# Patient Record
Sex: Male | Born: 1975 | Race: White | Hispanic: No | Marital: Married | Smoking: Never smoker
Health system: Southern US, Community
[De-identification: ages and names within clinical notes are randomized; demographics above are authoritative.]

## PROBLEM LIST (undated history)

## (undated) DIAGNOSIS — R42 Dizziness and giddiness: Secondary | ICD-10-CM

## (undated) DIAGNOSIS — R7989 Other specified abnormal findings of blood chemistry: Secondary | ICD-10-CM

## (undated) DIAGNOSIS — G08 Intracranial and intraspinal phlebitis and thrombophlebitis: Secondary | ICD-10-CM

## (undated) DIAGNOSIS — F419 Anxiety disorder, unspecified: Secondary | ICD-10-CM

## (undated) DIAGNOSIS — I82409 Acute embolism and thrombosis of unspecified deep veins of unspecified lower extremity: Secondary | ICD-10-CM

## (undated) DIAGNOSIS — D6852 Prothrombin gene mutation: Secondary | ICD-10-CM

## (undated) DIAGNOSIS — R945 Abnormal results of liver function studies: Secondary | ICD-10-CM

## (undated) DIAGNOSIS — J45909 Unspecified asthma, uncomplicated: Secondary | ICD-10-CM

## (undated) HISTORY — DX: Other specified abnormal findings of blood chemistry: R79.89

## (undated) HISTORY — DX: Abnormal results of liver function studies: R94.5

## (undated) HISTORY — PX: OTHER SURGICAL HISTORY: SHX169

---

## 2009-09-16 DIAGNOSIS — K219 Gastro-esophageal reflux disease without esophagitis: Secondary | ICD-10-CM | POA: Insufficient documentation

## 2011-01-14 DIAGNOSIS — J309 Allergic rhinitis, unspecified: Secondary | ICD-10-CM | POA: Insufficient documentation

## 2011-08-30 DIAGNOSIS — J329 Chronic sinusitis, unspecified: Secondary | ICD-10-CM | POA: Insufficient documentation

## 2017-03-17 ENCOUNTER — Other Ambulatory Visit: Payer: Self-pay | Admitting: Family Medicine

## 2017-03-17 DIAGNOSIS — I82531 Chronic embolism and thrombosis of right popliteal vein: Secondary | ICD-10-CM

## 2017-03-21 ENCOUNTER — Ambulatory Visit
Admission: RE | Admit: 2017-03-21 | Discharge: 2017-03-21 | Disposition: A | Discharge status: Home / Self Care | Payer: BLUE CROSS/BLUE SHIELD | Source: Ambulatory Visit | Attending: Family Medicine | Admitting: Family Medicine

## 2017-03-21 DIAGNOSIS — I82531 Chronic embolism and thrombosis of right popliteal vein: Secondary | ICD-10-CM

## 2017-05-28 DIAGNOSIS — H01002 Unspecified blepharitis right lower eyelid: Secondary | ICD-10-CM | POA: Diagnosis not present

## 2017-05-29 DIAGNOSIS — H00012 Hordeolum externum right lower eyelid: Secondary | ICD-10-CM | POA: Diagnosis not present

## 2017-06-16 DIAGNOSIS — I82531 Chronic embolism and thrombosis of right popliteal vein: Secondary | ICD-10-CM | POA: Diagnosis not present

## 2017-06-16 DIAGNOSIS — Z23 Encounter for immunization: Secondary | ICD-10-CM | POA: Diagnosis not present

## 2017-06-29 ENCOUNTER — Emergency Department (HOSPITAL_COMMUNITY)
Admission: EM | Admit: 2017-06-29 | Discharge: 2017-06-29 | Disposition: A | Discharge status: Home / Self Care | Payer: BLUE CROSS/BLUE SHIELD | Attending: Emergency Medicine | Admitting: Emergency Medicine

## 2017-06-29 ENCOUNTER — Emergency Department (HOSPITAL_COMMUNITY): Payer: BLUE CROSS/BLUE SHIELD

## 2017-06-29 ENCOUNTER — Encounter (HOSPITAL_COMMUNITY): Payer: Self-pay | Admitting: Emergency Medicine

## 2017-06-29 DIAGNOSIS — Z7901 Long term (current) use of anticoagulants: Secondary | ICD-10-CM | POA: Insufficient documentation

## 2017-06-29 DIAGNOSIS — J45909 Unspecified asthma, uncomplicated: Secondary | ICD-10-CM | POA: Insufficient documentation

## 2017-06-29 DIAGNOSIS — R002 Palpitations: Secondary | ICD-10-CM

## 2017-06-29 HISTORY — DX: Anxiety disorder, unspecified: F41.9

## 2017-06-29 HISTORY — DX: Unspecified asthma, uncomplicated: J45.909

## 2017-06-29 HISTORY — DX: Dizziness and giddiness: R42

## 2017-06-29 HISTORY — DX: Acute embolism and thrombosis of unspecified deep veins of unspecified lower extremity: I82.409

## 2017-06-29 LAB — CBC
HCT: 43.5 % (ref 39.0–52.0)
Hemoglobin: 15.3 g/dL (ref 13.0–17.0)
MCH: 30 pg (ref 26.0–34.0)
MCHC: 35.2 g/dL (ref 30.0–36.0)
MCV: 85.3 fL (ref 78.0–100.0)
PLATELETS: 217 10*3/uL (ref 150–400)
RBC: 5.1 MIL/uL (ref 4.22–5.81)
RDW: 13.8 % (ref 11.5–15.5)
WBC: 9.9 10*3/uL (ref 4.0–10.5)

## 2017-06-29 LAB — I-STAT TROPONIN, ED: Troponin i, poc: 0 ng/mL (ref 0.00–0.08)

## 2017-06-29 LAB — BASIC METABOLIC PANEL
Anion gap: 8 (ref 5–15)
BUN: 13 mg/dL (ref 6–20)
CO2: 27 mmol/L (ref 22–32)
CREATININE: 0.99 mg/dL (ref 0.61–1.24)
Calcium: 9 mg/dL (ref 8.9–10.3)
Chloride: 104 mmol/L (ref 101–111)
GFR calc Af Amer: >60 mL/min (ref >60)
GLUCOSE: 94 mg/dL (ref 65–99)
Potassium: 4 mmol/L (ref 3.5–5.1)
SODIUM: 139 mmol/L (ref 135–145)

## 2017-06-29 NOTE — ED Triage Notes (Signed)
Pt c/o palpitations and feeling like his heart is racing x 1 hour. States he was woken from sleep, denies chest pain, unsure if he was feeling short of breath. Hx panic attacks, and DVT. Has been taking eliquis for DVT, but has been forgetting to take doses in the last few weeks.

## 2017-06-29 NOTE — ED Provider Notes (Signed)
[facility]-EMERGENCY DEPT Provider Note   CSN: [id number] Arrival date & time: 06/29/17  0101   History   Chief Complaint Chief Complaint  Patient presents with  . Palpitations    HPI Shane Martin is a 41 y.o. male.  HPI   Patient has a PMH of anxiety, asthma, DVT, vertigo comes to the ER with complaints of palpitations and sensation of his heart racing for 1 hour that woke him up from sleep. His partner who is present says that he was "freaking out" asking if his voice sounded different and moving his arms and legs to make sure they still worked.  He is supposed to take Eliquis for hx of DVT but admits he has been forgetting to take the second dose this past few weeks. He had an ankle fracture 6 months ago which incited the DVT, he had a repeat Duplex recently that showed complete resolution of the DVT and he completes his course of Eliquis in the next few weeks. He denies having chest pain or shortness of breath. He admits to having hx of anxiety but nothing like this has happened before. The incident stopped on its own, the wife describes his chest as pounding fast to a regular rhythm    Past Medical History:  Diagnosis Date  . Anxiety   . Asthma   . DVT (deep venous thrombosis) (HCC)   . Vertigo     There are no active problems to display for this patient.  History reviewed. No pertinent surgical history.   Home Medications    Prior to Admission medications   Medication Sig Start Date End Date Taking? Authorizing Provider  apixaban (ELIQUIS) 5 MG TABS tablet Take 5 mg by mouth 2 (two) times daily.   Yes [provider]  cholecalciferol (VITAMIN D) 1000 units tablet Take 1,000 Units by mouth daily.   Yes [provider]  DiphenhydrAMINE HCl (ZZZQUIL) 50 MG/30ML LIQD Take 30 mLs by mouth at bedtime as needed (sleep).   Yes [provider]  vitamin B-12 (CYANOCOBALAMIN) 1000 MCG tablet Take 3,000 mcg by mouth daily.   Yes [provider]     Family History No family history on file.  Social History Social History  Substance Use Topics  . Smoking status: Never Smoker  . Smokeless tobacco: Never Used  . Alcohol use No     Allergies   Patient has no known allergies.   Review of Systems Review of Systems Negative ROS aside from pertinent positives and negatives as listed in HPI   Physical Exam Updated Vital Signs BP 119/72   Pulse (!) 54   Temp 97.7 F (36.5 C) (Oral)   Resp 12   Ht  (1.778 m)   Wt 93 kg (205 lb)   SpO2 99%   BMI 29.41 kg/m   Physical Exam  Constitutional: He appears well-developed and well-nourished. No distress.  HENT:  Head: Normocephalic and atraumatic.  Right Ear: Tympanic membrane and ear canal normal.  Left Ear: Tympanic membrane and ear canal normal.  Nose: Nose normal.  Mouth/Throat: Uvula is midline, oropharynx is clear and moist and mucous membranes are normal.  Eyes: Pupils are equal, round, and reactive to light.  Neck: Normal range of motion. Neck supple.  Cardiovascular: Normal rate and regular rhythm.   Pulmonary/Chest: Effort normal.  Abdominal: Soft.  No signs of abdominal distention  Musculoskeletal:  No LE swelling  Neurological: He is alert.  Acting at baseline  Skin: Skin is warm and  dry. No rash noted.  Nursing note and vitals reviewed.   ED Treatments / Results  Labs (all labs ordered are listed, but only abnormal results are displayed) Labs Reviewed  BASIC METABOLIC PANEL  CBC  I-STAT TROPONIN, ED    EKG  EKG Interpretation None       Radiology Dg Chest 2 View  Result Date: 06/29/2017 CLINICAL DATA:  Acute onset of palpitations.  Initial encounter. EXAM: CHEST  2 VIEW COMPARISON:  None. FINDINGS: The lungs are well-aerated and clear. There is no evidence of focal opacification, pleural effusion or pneumothorax. The heart is normal in size; the mediastinal contour is within normal limits. No acute osseous abnormalities are seen.  IMPRESSION: No acute cardiopulmonary process seen. Electronically Signed   By: Roanna Raider M.D.   On: 06/29/2017 02:07    Procedures Procedures (including critical care time)  Medications Ordered in ED Medications - No data to display   Initial Impression / Assessment and Plan / ED Course  I have reviewed the triage vital signs and the nursing notes.  Pertinent labs & imaging results that were available during my care of the patient were reviewed by me and considered in my medical decision making (see chart for details).     EKG shows normal sinus rhythm heart rate is 80.  Neg troponin, BMP and CBC are unremarkable. Chest xray shows no acute cardiopulmonary process. The Telemetry monitor does not show any abnormalities. It is unclear what tachycardic rhythm took place. What he does describes does sound like a component of anxiety, however its tough to say. Will refer him to Cardiology so that they can do routine follow-up because of strong family history as well as consider a holter monitor.   I also STRONGLY suggest he be diligent in taking his Eliquis correctly until it is complete to avoid complications. At this time he is sating 100% on room air, HR 60s, he never had any chest pain or SOB, no EKG abnormalities, supposedly DVT has resolved. I have very low suspicion for DVT at this time.  Final Clinical Impressions(s) / ED Diagnoses   Final diagnoses:  Palpitations    New Prescriptions New Prescriptions   No medications on file     Marlon Pel, Cordelia Poche 06/29/17 1610    Raeford Razor, MD 07/05/17 1002

## 2017-08-31 DIAGNOSIS — R42 Dizziness and giddiness: Secondary | ICD-10-CM | POA: Diagnosis not present

## 2017-08-31 DIAGNOSIS — L729 Follicular cyst of the skin and subcutaneous tissue, unspecified: Secondary | ICD-10-CM | POA: Diagnosis not present

## 2017-08-31 DIAGNOSIS — R740 Nonspecific elevation of levels of transaminase and lactic acid dehydrogenase [LDH]: Secondary | ICD-10-CM | POA: Diagnosis not present

## 2017-08-31 DIAGNOSIS — E7800 Pure hypercholesterolemia, unspecified: Secondary | ICD-10-CM | POA: Diagnosis not present

## 2017-09-13 DIAGNOSIS — R42 Dizziness and giddiness: Secondary | ICD-10-CM | POA: Insufficient documentation

## 2017-09-13 DIAGNOSIS — F419 Anxiety disorder, unspecified: Secondary | ICD-10-CM | POA: Insufficient documentation

## 2017-09-13 DIAGNOSIS — J45909 Unspecified asthma, uncomplicated: Secondary | ICD-10-CM | POA: Insufficient documentation

## 2017-09-13 DIAGNOSIS — I82409 Acute embolism and thrombosis of unspecified deep veins of unspecified lower extremity: Secondary | ICD-10-CM | POA: Insufficient documentation

## 2017-09-20 ENCOUNTER — Ambulatory Visit: Payer: BLUE CROSS/BLUE SHIELD | Admitting: Internal Medicine

## 2017-09-25 DIAGNOSIS — J4520 Mild intermittent asthma, uncomplicated: Secondary | ICD-10-CM | POA: Diagnosis not present

## 2017-09-25 DIAGNOSIS — R42 Dizziness and giddiness: Secondary | ICD-10-CM | POA: Diagnosis not present

## 2017-09-25 DIAGNOSIS — F419 Anxiety disorder, unspecified: Secondary | ICD-10-CM | POA: Diagnosis not present

## 2017-10-02 ENCOUNTER — Ambulatory Visit: Payer: BLUE CROSS/BLUE SHIELD | Admitting: Internal Medicine

## 2017-10-02 NOTE — Progress Notes (Signed)
    Referring-Kristin Ward DO Reason for referral-palpitations  HPI: 42 year old male for evaluation of palpitations at request of Kristen Ward DO. Patient seen in October with palpitations. Potassium 4.0. Troponin normal. Chest x-ray negative. Electrocardiogram showed sinus rhythm with no ST changes. Patient had his first episode of palpitations in October. He awoke from sleep with his heart racing. There was mild dyspnea and dizziness. No chest pain. Symptoms lasted approximately 30 minutes and resolved. He did well until approximately one month ago and he has had approximately 6 episodes of heart racing since that time. There is associated vertigo. No chest pain or syncope. He otherwise denies dyspnea on exertion, orthopnea, PND, pedal edema, exertional chest pain. Cardiology asked to evaluate.  Current Outpatient Medications  Medication Sig Dispense Refill  . cholecalciferol (VITAMIN D) 1000 units tablet Take 1,000 Units by mouth daily.    . vitamin B-12 (CYANOCOBALAMIN) 1000 MCG tablet Take 3,000 mcg by mouth daily.     No current facility-administered medications for this visit.     Allergies  Allergen Reactions  . Cats Claw (Uncaria Tomentosa) Shortness Of Breath     Past Medical History:  Diagnosis Date  . Anxiety   . Asthma   . DVT (deep venous thrombosis) (HCC)   . Elevated LFTs   . Vertigo     Past Surgical History:  Procedure Laterality Date  . No prior surgery      Social History   Socioeconomic History  . Marital status: Married    Spouse name: Not on file  . Number of children: Not on file  . Years of education: Not on file  . Highest education level: Not on file  Social Needs  . Financial resource strain: Not on file  . Food insecurity - worry: Not on file  . Food insecurity - inability: Not on file  . Transportation needs - medical: Not on file  . Transportation needs - non-medical: Not on file  Occupational History  . Not on file  Tobacco Use  .  Smoking status: Never Smoker  . Smokeless tobacco: Never Used  Substance and Sexual Activity  . Alcohol use: No  . Drug use: No  . Sexual activity: Not on file  Other Topics Concern  . Not on file  Social History Narrative  . Not on file    Family History  Problem Relation Age of Onset  . CAD Father     ROS: Anxiety but no fevers or chills, productive cough, hemoptysis, dysphasia, odynophagia, melena, hematochezia, dysuria, hematuria, rash, seizure activity, orthopnea, PND, pedal edema, claudication. Remaining systems are negative.  Physical Exam:   Blood pressure 102/68, pulse 70, height 5' 9.5" (1.765 m), weight 204 lb (92.5 kg).  General:  Well developed/well nourished in NAD Skin warm/dry Patient not depressed No peripheral clubbing Back-normal HEENT-normal/normal eyelids Neck supple/normal carotid upstroke bilaterally; no bruits; no JVD; no thyromegaly chest - CTA/ normal expansion CV - RRR/normal S1 and S2; no murmurs, rubs or gallops;  PMI nondisplaced Abdomen -NT/ND, no HSM, no mass, + bowel sounds, no bruit 2+ femoral pulses, no bruits Ext-no edema, chords, 2+ DP Neuro-grossly nonfocal  ECG - normal sinus rhythm with no ST changes. personally reviewed  A/P  1 palpitations- etiology unclear. I will arrange an event monitor to further assess. Check echocardiogram for LV function. Check TSH.  2 history of anxiety-management per primary care.  3 asthma-symptoms are well-controlled. Follow-up primary care.  Olga MillersBrian Crenshaw, MD

## 2017-10-06 ENCOUNTER — Ambulatory Visit (INDEPENDENT_AMBULATORY_CARE_PROVIDER_SITE_OTHER): Payer: BLUE CROSS/BLUE SHIELD | Admitting: Cardiology

## 2017-10-06 ENCOUNTER — Encounter: Payer: Self-pay | Admitting: Cardiology

## 2017-10-06 VITALS — BP 102/68 | HR 70 | Ht 69.5 in | Wt 204.0 lb

## 2017-10-06 DIAGNOSIS — R002 Palpitations: Secondary | ICD-10-CM | POA: Diagnosis not present

## 2017-10-06 NOTE — Patient Instructions (Signed)
Medication Instructions:   NO CHANGE  Labwork:  Your physician recommends that you HAVE LAB WORK TODAY  Testing/Procedures:  Your physician has recommended that you wear an event monitor. Event monitors are medical devices that record the heart's electrical activity. Doctors most often us these monitors to diagnose arrhythmias. Arrhythmias are problems with the speed or rhythm of the heartbeat. The monitor is a small, portable device. You can wear one while you do your normal daily activities. This is usually used to diagnose what is causing palpitations/syncope (passing out).   Your physician has requested that you have an echocardiogram. Echocardiography is a painless test that uses sound waves to create images of your heart. It provides your doctor with information about the size and shape of your heart and how well your heart's chambers and valves are working. This procedure takes approximately one hour. There are no restrictions for this procedure.    Follow-Up:  Your physician recommends that you schedule a follow-up appointment in: 3 MONTHS WITH DR Jens SomRENSHAW

## 2017-10-07 LAB — TSH: TSH: 3.31 u[IU]/mL (ref 0.450–4.500)

## 2017-10-17 ENCOUNTER — Other Ambulatory Visit: Payer: Self-pay

## 2017-10-17 ENCOUNTER — Ambulatory Visit (INDEPENDENT_AMBULATORY_CARE_PROVIDER_SITE_OTHER): Payer: BLUE CROSS/BLUE SHIELD

## 2017-10-17 ENCOUNTER — Ambulatory Visit (HOSPITAL_COMMUNITY): Payer: BLUE CROSS/BLUE SHIELD | Attending: Cardiovascular Disease

## 2017-10-17 DIAGNOSIS — Z86718 Personal history of other venous thrombosis and embolism: Secondary | ICD-10-CM | POA: Diagnosis not present

## 2017-10-17 DIAGNOSIS — I071 Rheumatic tricuspid insufficiency: Secondary | ICD-10-CM | POA: Diagnosis not present

## 2017-10-17 DIAGNOSIS — R002 Palpitations: Secondary | ICD-10-CM

## 2017-11-02 DIAGNOSIS — M7652 Patellar tendinitis, left knee: Secondary | ICD-10-CM | POA: Diagnosis not present

## 2017-12-27 NOTE — Progress Notes (Deleted)
      HPI: FU palpitations.  Echocardiogram January 2019 showed normal LV function.  Monitor January 2019 showed sinus bradycardia, normal sinus rhythm and sinus tachycardia.  Since last seen,    Current Outpatient Medications  Medication Sig Dispense Refill  . cholecalciferol (VITAMIN D) 1000 units tablet Take 1,000 Units by mouth daily.    . vitamin B-12 (CYANOCOBALAMIN) 1000 MCG tablet Take 3,000 mcg by mouth daily.     No current facility-administered medications for this visit.      Past Medical History:  Diagnosis Date  . Anxiety   . Asthma   . DVT (deep venous thrombosis) (HCC)   . Elevated LFTs   . Vertigo     Past Surgical History:  Procedure Laterality Date  . No prior surgery      Social History   Socioeconomic History  . Marital status: Married    Spouse name: Not on file  . Number of children: Not on file  . Years of education: Not on file  . Highest education level: Not on file  Occupational History  . Not on file  Social Needs  . Financial resource strain: Not on file  . Food insecurity:    Worry: Not on file    Inability: Not on file  . Transportation needs:    Medical: Not on file    Non-medical: Not on file  Tobacco Use  . Smoking status: Never Smoker  . Smokeless tobacco: Never Used  Substance and Sexual Activity  . Alcohol use: No  . Drug use: No  . Sexual activity: Not on file  Lifestyle  . Physical activity:    Days per week: Not on file    Minutes per session: Not on file  . Stress: Not on file  Relationships  . Social connections:    Talks on phone: Not on file    Gets together: Not on file    Attends religious service: Not on file    Active member of club or organization: Not on file    Attends meetings of clubs or organizations: Not on file    Relationship status: Not on file  . Intimate partner violence:    Fear of current or ex partner: Not on file    Emotionally abused: Not on file    Physically abused: Not on file    Forced sexual activity: Not on file  Other Topics Concern  . Not on file  Social History Narrative  . Not on file    Family History  Problem Relation Age of Onset  . CAD Father     ROS: no fevers or chills, productive cough, hemoptysis, dysphasia, odynophagia, melena, hematochezia, dysuria, hematuria, rash, seizure activity, orthopnea, PND, pedal edema, claudication. Remaining systems are negative.  Physical Exam: Well-developed well-nourished in no acute distress.  Skin is warm and dry.  HEENT is normal.  Neck is supple.  Chest is clear to auscultation with normal expansion.  Cardiovascular exam is regular rate and rhythm.  Abdominal exam nontender or distended. No masses palpated. Extremities show no edema. neuro grossly intact  ECG- personally reviewed  A/P  1  Shane MillersBrian Hance Caspers, MD

## 2018-01-10 ENCOUNTER — Ambulatory Visit: Payer: BLUE CROSS/BLUE SHIELD | Admitting: Cardiology

## 2018-03-21 DIAGNOSIS — Z315 Encounter for genetic counseling: Secondary | ICD-10-CM | POA: Diagnosis not present

## 2018-03-21 DIAGNOSIS — Z31440 Encounter of male for testing for genetic disease carrier status for procreative management: Secondary | ICD-10-CM | POA: Diagnosis not present

## 2018-03-29 NOTE — Progress Notes (Signed)
HPI: FU palpitations.  Echocardiogram January 2019 showed normal LV function.  Monitor January 2019 showed no significant arrhythmia.  TSH January 2019 normal.  Since last seen patient has had brief flutters but much improved.  He denies dyspnea on exertion, orthopnea, PND, pedal edema, exertional chest pain or syncope.  Current Outpatient Medications  Medication Sig Dispense Refill  . cholecalciferol (VITAMIN D) 1000 units tablet Take 1,000 Units by mouth daily.    . fluticasone (FLONASE) 50 MCG/ACT nasal spray Place into both nostrils as directed.    . vitamin B-12 (CYANOCOBALAMIN) 1000 MCG tablet Take 3,000 mcg by mouth daily.     No current facility-administered medications for this visit.      Past Medical History:  Diagnosis Date  . Anxiety   . Asthma   . DVT (deep venous thrombosis) (HCC)   . Elevated LFTs   . Vertigo     Past Surgical History:  Procedure Laterality Date  . No prior surgery      Social History   Socioeconomic History  . Marital status: Married    Spouse name: Not on file  . Number of children: Not on file  . Years of education: Not on file  . Highest education level: Not on file  Occupational History  . Not on file  Social Needs  . Financial resource strain: Not on file  . Food insecurity:    Worry: Not on file    Inability: Not on file  . Transportation needs:    Medical: Not on file    Non-medical: Not on file  Tobacco Use  . Smoking status: Never Smoker  . Smokeless tobacco: Never Used  Substance and Sexual Activity  . Alcohol use: No  . Drug use: No  . Sexual activity: Not on file  Lifestyle  . Physical activity:    Days per week: Not on file    Minutes per session: Not on file  . Stress: Not on file  Relationships  . Social connections:    Talks on phone: Not on file    Gets together: Not on file    Attends religious service: Not on file    Active member of club or organization: Not on file    Attends meetings of  clubs or organizations: Not on file    Relationship status: Not on file  . Intimate partner violence:    Fear of current or ex partner: Not on file    Emotionally abused: Not on file    Physically abused: Not on file    Forced sexual activity: Not on file  Other Topics Concern  . Not on file  Social History Narrative  . Not on file    Family History  Problem Relation Age of Onset  . CAD Father     ROS: no fevers or chills, productive cough, hemoptysis, dysphasia, odynophagia, melena, hematochezia, dysuria, hematuria, rash, seizure activity, orthopnea, PND, pedal edema, claudication. Remaining systems are negative.  Physical Exam: Well-developed well-nourished in no acute distress.  Skin is warm and dry.  HEENT is normal.  Neck is supple.  Chest is clear to auscultation with normal expansion.  Cardiovascular exam is regular rate and rhythm.  Abdominal exam nontender or distended. No masses palpated. Extremities show no edema. neuro grossly intact  A/P  1 palpitations-echocardiogram showed normal LV function.  Monitor showed no significant arrhythmia.  Symptoms have improved.  We can consider adding a beta-blocker in the future if needed.  2 history of anxiety-management per primary care.  3 history of asthma-managed by primary care.  Olga Millers, MD

## 2018-04-03 ENCOUNTER — Ambulatory Visit (INDEPENDENT_AMBULATORY_CARE_PROVIDER_SITE_OTHER): Payer: BLUE CROSS/BLUE SHIELD | Admitting: Cardiology

## 2018-04-03 ENCOUNTER — Encounter: Payer: Self-pay | Admitting: Cardiology

## 2018-04-03 VITALS — BP 118/76 | HR 57 | Ht 69.5 in | Wt 204.2 lb

## 2018-04-03 DIAGNOSIS — R002 Palpitations: Secondary | ICD-10-CM

## 2018-04-03 NOTE — Patient Instructions (Signed)
Your physician recommends that you schedule a follow-up appointment in: AS NEEDED  

## 2018-06-12 DIAGNOSIS — Z23 Encounter for immunization: Secondary | ICD-10-CM | POA: Diagnosis not present

## 2018-06-12 DIAGNOSIS — H00014 Hordeolum externum left upper eyelid: Secondary | ICD-10-CM | POA: Diagnosis not present

## 2018-06-26 DIAGNOSIS — Z13220 Encounter for screening for lipoid disorders: Secondary | ICD-10-CM | POA: Diagnosis not present

## 2018-06-26 DIAGNOSIS — Z0000 Encounter for general adult medical examination without abnormal findings: Secondary | ICD-10-CM | POA: Diagnosis not present

## 2018-10-11 IMAGING — CR DG CHEST 2V
2 series · 2 of 2 positions shown · non-contrast
Comparison: None.

CLINICAL DATA: Acute onset of palpitations.  Initial encounter.

EXAM:
CHEST  2 VIEW

[chest pa]
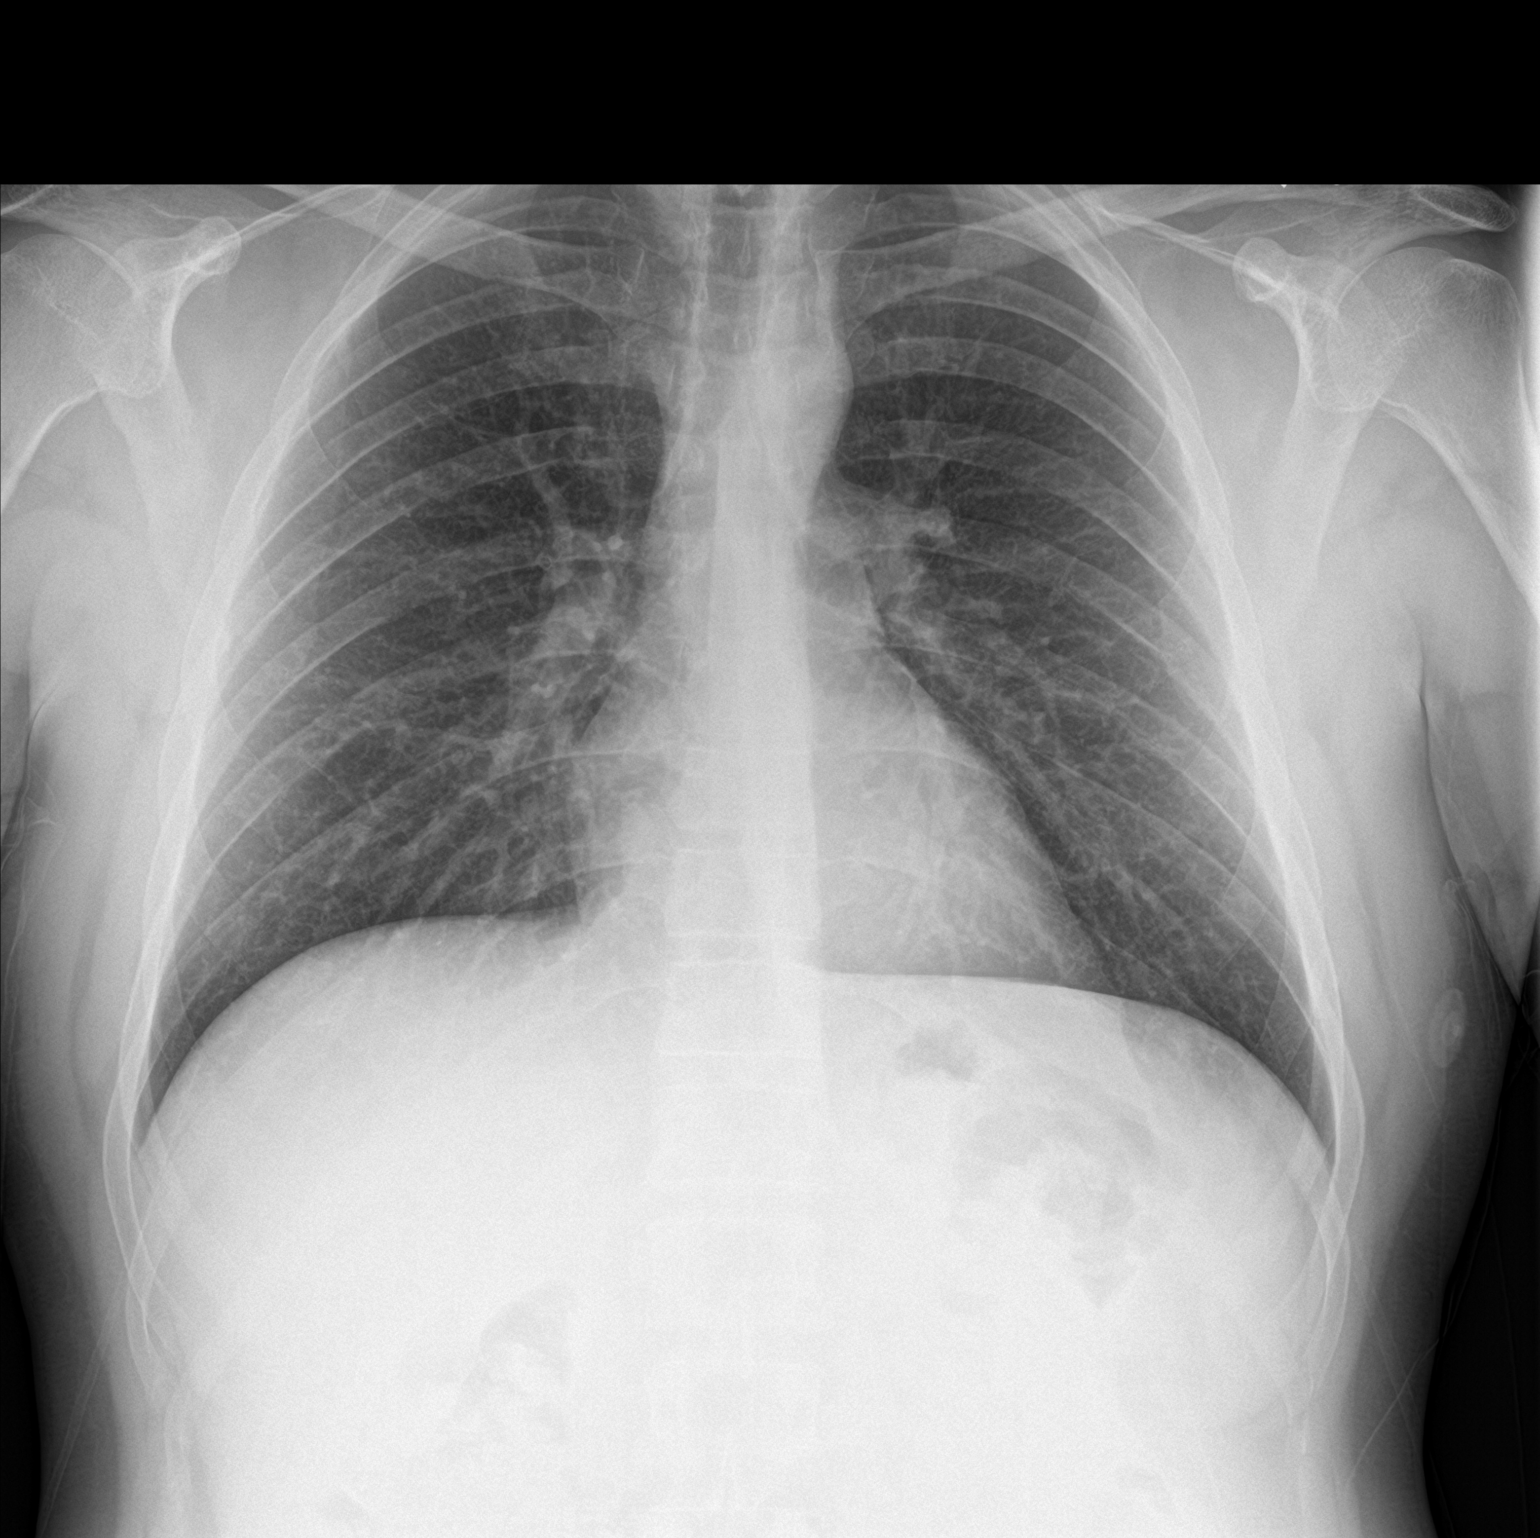

[chest lat]
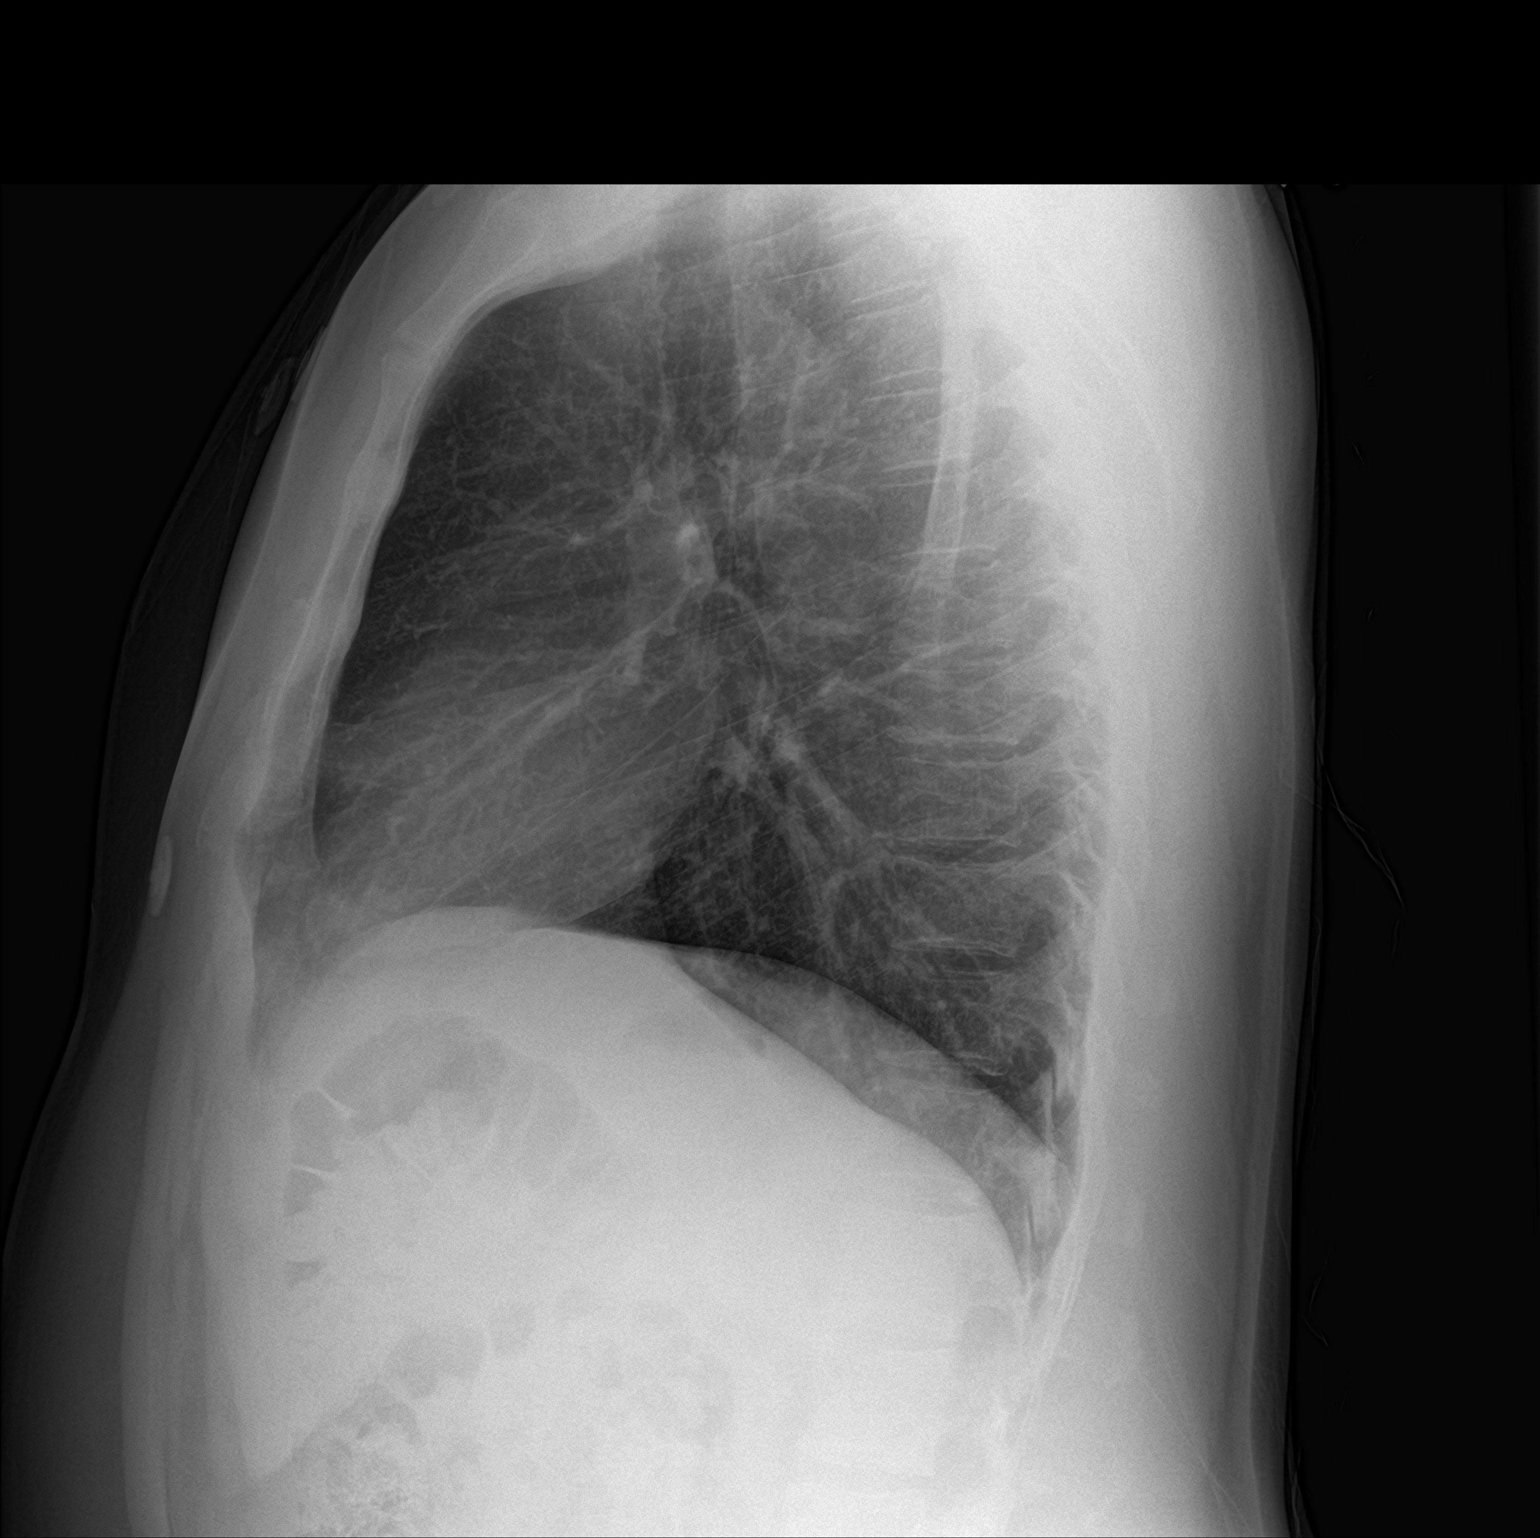

[2 of 2 positions shown; findings below may reference images not displayed]

FINDINGS: The lungs are well-aerated and clear. There is no evidence of focal
opacification, pleural effusion or pneumothorax.

The heart is normal in size; the mediastinal contour is within
normal limits. No acute osseous abnormalities are seen.
IMPRESSION: No acute cardiopulmonary process seen.

## 2019-06-18 DIAGNOSIS — Z23 Encounter for immunization: Secondary | ICD-10-CM | POA: Diagnosis not present

## 2019-07-08 DIAGNOSIS — Z0000 Encounter for general adult medical examination without abnormal findings: Secondary | ICD-10-CM | POA: Diagnosis not present

## 2019-08-21 ENCOUNTER — Ambulatory Visit: Payer: Self-pay | Admitting: *Deleted

## 2019-08-21 DIAGNOSIS — N390 Urinary tract infection, site not specified: Secondary | ICD-10-CM | POA: Diagnosis not present

## 2019-08-21 NOTE — Telephone Encounter (Signed)
Pt and his wife called in regards to him having a possible blood clot in his leg. he has a history of superficial blood lots in his left leg  He broke his right ankle and developed a dvt in that leg before. His rt leg feels warmer and is crampy when he is walking,  They are requesting an ultra sound to rule out a blood clot. They are advised to go to an ED to get an u/s. Advised that if it is a blood clot and it breaks loose, he may have problems with it getting in his lung and having a pulmonary embolus.  They voiced understanding and will be going to the Ed.   Reason for Disposition . History of prior "blood clot" in leg or lungs (i.e., deep vein thrombosis, pulmonary embolism)  Answer Assessment - Initial Assessment Questions 1. ONSET: "When did the pain start?"      today 2. LOCATION: "Where is the pain located?"      Right leg 3. PAIN: "How bad is the pain?"    (Scale 1-10; or mild, moderate, severe)   -  MILD (1-3): doesn't interfere with normal activities    -  MODERATE (4-7): interferes with normal activities (e.g., work or school) or awakens from sleep, limping    -  SEVERE (8-10): excruciating pain, unable to do any normal activities, unable to walk     crampy when walking 4. WORK OR EXERCISE: "Has there been any recent work or exercise that involved this part of the body?"      no 5. CAUSE: "What do you think is causing the leg pain?"     Possible blood clot 6. OTHER SYMPTOMS: "Do you have any other symptoms?" (e.g., chest pain, back pain, breathing difficulty, swelling, rash, fever, numbness, weakness)     no 7. PREGNANCY: "Is there any chance you are pregnant?" "When was your last menstrual period?"     n/a  Protocols used: LEG PAIN-A-AH

## 2019-08-22 ENCOUNTER — Other Ambulatory Visit (HOSPITAL_BASED_OUTPATIENT_CLINIC_OR_DEPARTMENT_OTHER): Payer: Self-pay

## 2019-08-22 ENCOUNTER — Other Ambulatory Visit (HOSPITAL_BASED_OUTPATIENT_CLINIC_OR_DEPARTMENT_OTHER): Payer: Self-pay | Admitting: Family Medicine

## 2019-08-22 ENCOUNTER — Encounter (HOSPITAL_BASED_OUTPATIENT_CLINIC_OR_DEPARTMENT_OTHER): Payer: Self-pay

## 2019-08-22 ENCOUNTER — Other Ambulatory Visit: Payer: Self-pay | Admitting: Family Medicine

## 2019-08-22 ENCOUNTER — Ambulatory Visit (HOSPITAL_BASED_OUTPATIENT_CLINIC_OR_DEPARTMENT_OTHER): Payer: BC Managed Care – PPO

## 2019-08-22 DIAGNOSIS — M79604 Pain in right leg: Secondary | ICD-10-CM

## 2019-08-22 DIAGNOSIS — M79605 Pain in left leg: Secondary | ICD-10-CM

## 2019-08-23 ENCOUNTER — Ambulatory Visit
Admission: RE | Admit: 2019-08-23 | Discharge: 2019-08-23 | Disposition: A | Discharge status: Home / Self Care | Payer: BC Managed Care – PPO | Source: Ambulatory Visit | Attending: Family Medicine | Admitting: Family Medicine

## 2019-08-23 ENCOUNTER — Other Ambulatory Visit: Payer: BC Managed Care – PPO

## 2019-08-23 DIAGNOSIS — I82431 Acute embolism and thrombosis of right popliteal vein: Secondary | ICD-10-CM | POA: Diagnosis not present

## 2019-08-23 DIAGNOSIS — M79604 Pain in right leg: Secondary | ICD-10-CM

## 2019-08-23 DIAGNOSIS — M79662 Pain in left lower leg: Secondary | ICD-10-CM | POA: Diagnosis not present

## 2019-08-23 DIAGNOSIS — M79605 Pain in left leg: Secondary | ICD-10-CM

## 2020-01-06 DIAGNOSIS — R6884 Jaw pain: Secondary | ICD-10-CM | POA: Diagnosis not present

## 2020-04-01 DIAGNOSIS — M25571 Pain in right ankle and joints of right foot: Secondary | ICD-10-CM | POA: Diagnosis not present

## 2020-06-16 DIAGNOSIS — Z23 Encounter for immunization: Secondary | ICD-10-CM | POA: Diagnosis not present

## 2020-08-11 ENCOUNTER — Ambulatory Visit: Payer: BC Managed Care – PPO | Attending: Internal Medicine

## 2020-08-11 DIAGNOSIS — Z23 Encounter for immunization: Secondary | ICD-10-CM

## 2020-08-11 NOTE — Progress Notes (Signed)
   Covid-19 Vaccination Clinic  Name:  Shane Martin    MRN: [id number] DOB: 1976-03-25  08/11/2020  [name] was observed post Covid-19 immunization for 30 minutes based on pre-vaccination screening without incident. He was provided with Vaccine Information Sheet and instruction to access the V-Safe system.   Shane Martin was instructed to call 911 with any severe reactions post vaccine: Marland Kitchen Difficulty breathing  . Swelling of face and throat  . A fast heartbeat  . A bad rash all over body  . Dizziness and weakness   Immunizations Administered    No immunizations on file.

## 2020-08-29 DIAGNOSIS — J45901 Unspecified asthma with (acute) exacerbation: Secondary | ICD-10-CM | POA: Diagnosis not present

## 2020-08-29 DIAGNOSIS — J22 Unspecified acute lower respiratory infection: Secondary | ICD-10-CM | POA: Diagnosis not present

## 2020-09-07 DIAGNOSIS — J209 Acute bronchitis, unspecified: Secondary | ICD-10-CM | POA: Diagnosis not present

## 2020-11-17 DIAGNOSIS — Z86718 Personal history of other venous thrombosis and embolism: Secondary | ICD-10-CM | POA: Diagnosis not present

## 2020-11-17 DIAGNOSIS — M79661 Pain in right lower leg: Secondary | ICD-10-CM | POA: Diagnosis not present

## 2020-11-18 ENCOUNTER — Ambulatory Visit (HOSPITAL_COMMUNITY)
Admission: RE | Admit: 2020-11-18 | Discharge: 2020-11-18 | Disposition: A | Discharge status: Home / Self Care | Payer: BC Managed Care – PPO | Source: Ambulatory Visit | Attending: Family Medicine | Admitting: Family Medicine

## 2020-11-18 ENCOUNTER — Other Ambulatory Visit: Payer: Self-pay

## 2020-11-18 ENCOUNTER — Other Ambulatory Visit (HOSPITAL_COMMUNITY): Payer: Self-pay | Admitting: Family Medicine

## 2020-11-18 DIAGNOSIS — M7989 Other specified soft tissue disorders: Secondary | ICD-10-CM

## 2020-11-18 DIAGNOSIS — M79604 Pain in right leg: Secondary | ICD-10-CM | POA: Insufficient documentation

## 2020-11-18 NOTE — Progress Notes (Signed)
Right lower extremity venous duplex has been completed. Preliminary results can be found in CV Proc through chart review.  Results were faxed to Dr. Lucianne Muss.  11/18/20 1:20 PM Olen Cordial RVT

## 2021-07-01 DIAGNOSIS — M549 Dorsalgia, unspecified: Secondary | ICD-10-CM | POA: Diagnosis not present

## 2021-07-01 DIAGNOSIS — L089 Local infection of the skin and subcutaneous tissue, unspecified: Secondary | ICD-10-CM | POA: Diagnosis not present

## 2021-07-02 ENCOUNTER — Ambulatory Visit: Payer: BC Managed Care – PPO | Attending: Internal Medicine

## 2021-07-02 DIAGNOSIS — Z23 Encounter for immunization: Secondary | ICD-10-CM

## 2021-07-02 NOTE — Progress Notes (Signed)
   Covid-19 Vaccination Clinic  Name:  Shane Martin    MRN: [id number] DOB: Apr 13, 1976  07/02/2021  Shane Martin was observed post Covid-19 immunization for 15 minutes without incident. He was provided with Vaccine Information Sheet and instruction to access the V-Safe system.   Shane Martin was instructed to call 911 with any severe reactions post vaccine: Difficulty breathing  Swelling of face and throat  A fast heartbeat  A bad rash all over body  Dizziness and weakness

## 2021-07-12 DIAGNOSIS — Z86718 Personal history of other venous thrombosis and embolism: Secondary | ICD-10-CM | POA: Diagnosis not present

## 2021-07-12 DIAGNOSIS — Z0000 Encounter for general adult medical examination without abnormal findings: Secondary | ICD-10-CM | POA: Diagnosis not present

## 2021-07-12 DIAGNOSIS — Z1329 Encounter for screening for other suspected endocrine disorder: Secondary | ICD-10-CM | POA: Diagnosis not present

## 2021-07-12 DIAGNOSIS — Z13220 Encounter for screening for lipoid disorders: Secondary | ICD-10-CM | POA: Diagnosis not present

## 2021-07-16 DIAGNOSIS — J0110 Acute frontal sinusitis, unspecified: Secondary | ICD-10-CM | POA: Diagnosis not present

## 2021-07-18 ENCOUNTER — Inpatient Hospital Stay (HOSPITAL_BASED_OUTPATIENT_CLINIC_OR_DEPARTMENT_OTHER)
Admission: EM | Admit: 2021-07-18 | Discharge: 2021-07-20 | DRG: 093 | Disposition: A | Discharge status: Home / Self Care | Payer: BC Managed Care – PPO | Attending: Internal Medicine | Admitting: Internal Medicine

## 2021-07-18 ENCOUNTER — Encounter (HOSPITAL_BASED_OUTPATIENT_CLINIC_OR_DEPARTMENT_OTHER): Payer: Self-pay | Admitting: Obstetrics and Gynecology

## 2021-07-18 ENCOUNTER — Emergency Department (HOSPITAL_BASED_OUTPATIENT_CLINIC_OR_DEPARTMENT_OTHER): Payer: BC Managed Care – PPO

## 2021-07-18 ENCOUNTER — Emergency Department (HOSPITAL_COMMUNITY): Payer: BC Managed Care – PPO

## 2021-07-18 ENCOUNTER — Other Ambulatory Visit: Payer: Self-pay

## 2021-07-18 DIAGNOSIS — R9089 Other abnormal findings on diagnostic imaging of central nervous system: Secondary | ICD-10-CM | POA: Diagnosis not present

## 2021-07-18 DIAGNOSIS — R519 Headache, unspecified: Secondary | ICD-10-CM | POA: Diagnosis not present

## 2021-07-18 DIAGNOSIS — Z91048 Other nonmedicinal substance allergy status: Secondary | ICD-10-CM | POA: Diagnosis not present

## 2021-07-18 DIAGNOSIS — G08 Intracranial and intraspinal phlebitis and thrombophlebitis: Secondary | ICD-10-CM | POA: Diagnosis not present

## 2021-07-18 DIAGNOSIS — F419 Anxiety disorder, unspecified: Secondary | ICD-10-CM | POA: Diagnosis not present

## 2021-07-18 DIAGNOSIS — R930 Abnormal findings on diagnostic imaging of skull and head, not elsewhere classified: Secondary | ICD-10-CM | POA: Diagnosis not present

## 2021-07-18 DIAGNOSIS — E7800 Pure hypercholesterolemia, unspecified: Secondary | ICD-10-CM | POA: Diagnosis present

## 2021-07-18 DIAGNOSIS — R9431 Abnormal electrocardiogram [ECG] [EKG]: Secondary | ICD-10-CM | POA: Diagnosis not present

## 2021-07-18 DIAGNOSIS — J45909 Unspecified asthma, uncomplicated: Secondary | ICD-10-CM | POA: Diagnosis present

## 2021-07-18 DIAGNOSIS — G932 Benign intracranial hypertension: Secondary | ICD-10-CM | POA: Diagnosis present

## 2021-07-18 DIAGNOSIS — J45990 Exercise induced bronchospasm: Secondary | ICD-10-CM | POA: Diagnosis not present

## 2021-07-18 DIAGNOSIS — Z20822 Contact with and (suspected) exposure to covid-19: Secondary | ICD-10-CM | POA: Diagnosis not present

## 2021-07-18 DIAGNOSIS — Z86718 Personal history of other venous thrombosis and embolism: Secondary | ICD-10-CM | POA: Diagnosis not present

## 2021-07-18 DIAGNOSIS — M436 Torticollis: Secondary | ICD-10-CM | POA: Diagnosis present

## 2021-07-18 DIAGNOSIS — Z79899 Other long term (current) drug therapy: Secondary | ICD-10-CM

## 2021-07-18 LAB — CBC WITH DIFFERENTIAL/PLATELET
Abs Immature Granulocytes: 0.05 10*3/uL (ref 0.00–0.07)
Basophils Absolute: 0.1 10*3/uL (ref 0.0–0.1)
Basophils Relative: 1 %
Eosinophils Absolute: 0.1 10*3/uL (ref 0.0–0.5)
Eosinophils Relative: 1 %
HCT: 46 % (ref 39.0–52.0)
Hemoglobin: 16 g/dL (ref 13.0–17.0)
Immature Granulocytes: 0 %
Lymphocytes Relative: 17 %
Lymphs Abs: 2 10*3/uL (ref 0.7–4.0)
MCH: 28.9 pg (ref 26.0–34.0)
MCHC: 34.8 g/dL (ref 30.0–36.0)
MCV: 83.2 fL (ref 80.0–100.0)
Monocytes Absolute: 0.8 10*3/uL (ref 0.1–1.0)
Monocytes Relative: 7 %
Neutro Abs: 8.8 10*3/uL — ABNORMAL HIGH (ref 1.7–7.7)
Neutrophils Relative %: 74 %
Platelets: 255 10*3/uL (ref 150–400)
RBC: 5.53 MIL/uL (ref 4.22–5.81)
RDW: 13.2 % (ref 11.5–15.5)
WBC: 11.9 10*3/uL — ABNORMAL HIGH (ref 4.0–10.5)
nRBC: 0 % (ref 0.0–0.2)

## 2021-07-18 LAB — COMPREHENSIVE METABOLIC PANEL
ALT: 45 U/L — ABNORMAL HIGH (ref 0–44)
AST: 27 U/L (ref 15–41)
Albumin: 4.4 g/dL (ref 3.5–5.0)
Alkaline Phosphatase: 62 U/L (ref 38–126)
Anion gap: 10 (ref 5–15)
BUN: 14 mg/dL (ref 6–20)
CO2: 29 mmol/L (ref 22–32)
Calcium: 9.2 mg/dL (ref 8.9–10.3)
Chloride: 100 mmol/L (ref 98–111)
Creatinine, Ser: 0.88 mg/dL (ref 0.61–1.24)
GFR, Estimated: >60 mL/min (ref >60)
Glucose, Bld: 125 mg/dL — ABNORMAL HIGH (ref 70–99)
Potassium: 3.9 mmol/L (ref 3.5–5.1)
Sodium: 139 mmol/L (ref 135–145)
Total Bilirubin: 0.8 mg/dL (ref 0.3–1.2)
Total Protein: 8 g/dL (ref 6.5–8.1)

## 2021-07-18 LAB — PROTIME-INR
INR: 0.9 (ref 0.8–1.2)
Prothrombin Time: 12.2 seconds (ref 11.4–15.2)

## 2021-07-18 LAB — RESP PANEL BY RT-PCR (FLU A&B, COVID) ARPGX2
Influenza A by PCR: NEGATIVE
Influenza B by PCR: NEGATIVE
SARS Coronavirus 2 by RT PCR: NEGATIVE

## 2021-07-18 LAB — LACTIC ACID, PLASMA: Lactic Acid, Venous: 1.5 mmol/L (ref 0.5–1.9)

## 2021-07-18 MED ORDER — HEPARIN (PORCINE) 25000 UT/250ML-% IV SOLN
1300.0000 [IU]/h | INTRAVENOUS | Status: AC
  Administered 2021-07-18: 1000 [IU]/h via INTRAVENOUS
  Administered 2021-07-19: 1450 [IU]/h via INTRAVENOUS
  Administered 2021-07-20: 1400 [IU]/h via INTRAVENOUS
  Filled 2021-07-18 (×2): qty 250

## 2021-07-18 MED ORDER — ALBUTEROL SULFATE HFA 108 (90 BASE) MCG/ACT IN AERS: 1.0000 | INHALATION_SPRAY | Freq: Four times a day (QID) | RESPIRATORY_TRACT | Status: DC | PRN

## 2021-07-18 MED ORDER — ACETAMINOPHEN 325 MG PO TABS
650.0000 mg | ORAL_TABLET | Freq: Once | ORAL | Status: AC
  Administered 2021-07-18: 650 mg via ORAL
  Filled 2021-07-18: qty 2

## 2021-07-18 MED ORDER — ACETAMINOPHEN 325 MG PO TABS
650.0000 mg | ORAL_TABLET | Freq: Four times a day (QID) | ORAL | Status: DC | PRN
  Administered 2021-07-18: 650 mg via ORAL
  Filled 2021-07-18: qty 2

## 2021-07-18 MED ORDER — ALBUTEROL SULFATE (2.5 MG/3ML) 0.083% IN NEBU: 2.5000 mg | INHALATION_SOLUTION | Freq: Four times a day (QID) | RESPIRATORY_TRACT | Status: DC | PRN

## 2021-07-18 MED ORDER — ACETAMINOPHEN 650 MG RE SUPP: 650.0000 mg | Freq: Four times a day (QID) | RECTAL | Status: DC | PRN

## 2021-07-18 MED ORDER — SODIUM CHLORIDE 0.9 % IV SOLN: 250.0000 mL | INTRAVENOUS | Status: DC | PRN

## 2021-07-18 MED ORDER — SODIUM CHLORIDE 0.9% FLUSH
3.0000 mL | Freq: Two times a day (BID) | INTRAVENOUS | Status: DC
  Administered 2021-07-19 – 2021-07-20 (×2): 3 mL via INTRAVENOUS

## 2021-07-18 MED ORDER — GADOBUTROL 1 MMOL/ML IV SOLN
9.2000 mL | Freq: Once | INTRAVENOUS | Status: AC | PRN
  Administered 2021-07-18: 9.2 mL via INTRAVENOUS

## 2021-07-18 MED ORDER — SODIUM CHLORIDE 0.9% FLUSH: 3.0000 mL | INTRAVENOUS | Status: DC | PRN

## 2021-07-18 NOTE — ED Notes (Signed)
Pt alert, NAD, calm, interactive, resps e/u, speech clear. C/o HA, recent intermittent nausea. Denies visual changes, dizziness, sob, vomiting, fall or injury. H/o DVT, h/o vertigo. No neuro deficits noted. EDP Dr. Durwin Nora at [facility].

## 2021-07-18 NOTE — ED Notes (Signed)
Vasc surg at [facility]

## 2021-07-18 NOTE — ED Provider Notes (Signed)
Care of patient assumed from Dr. Wallace Cullens.  This patient was seen at [facility] for 1 week of headache and neck pain.  CT scan was concerning for possible dural venous sinus thrombosis.  This was discussed with the neurologist on-call who recommended transport to [facility] for MRI/MRV.  Patient arrives alert and oriented. Physical Exam  BP 126/87 (BP Location: Right Arm)   Pulse 88   Temp 98.1 F (36.7 C)   Resp 17   Ht 5\' 10"  (1.778 m)   Wt 92.1 kg   SpO2 99%   BMI 29.13 kg/m   Physical Exam Constitutional:      General: He is not in acute distress.    Appearance: Normal appearance. He is normal weight. He is not ill-appearing, toxic-appearing or diaphoretic.  HENT:     Head: Normocephalic and atraumatic.  Eyes:     General: No visual field deficit. Skin:    General: Skin is warm and dry.  Neurological:     General: No focal deficit present.     Mental Status: He is alert and oriented to person, place, and time.     Cranial Nerves: No cranial nerve deficit, dysarthria or facial asymmetry.     Sensory: Sensation is intact. No sensory deficit.     Motor: Motor function is intact. No weakness, abnormal muscle tone or pronator drift.     Coordination: Coordination is intact. Coordination normal. Finger-Nose-Finger Test normal.  Psychiatric:        Mood and Affect: Mood normal.        Behavior: Behavior normal.        Thought Content: Thought content normal.        Judgment: Judgment normal.    ED Course/Procedures     Procedures  MDM  Patient arrives as transfer from [facility] for further evaluation of possible dural venous sinus thrombosis and/or small intracranial hemorrhage.  He is well-appearing on arrival.  He has no focal neurologic deficits.  He does endorse mild right-sided headache pain.  Tylenol was ordered.  I did confirm plan for MRI/MRV with neurology.  These orders were placed by neuro hospitalist who also stated that he would come  see the patient in the ED.  Disposition will be pending results of further imaging studies.  Care of patient was signed out to oncoming ED provider.       [facility], MD 07/18/21 [phone]

## 2021-07-18 NOTE — ED Notes (Signed)
Pt given sandwich bag, cheese, crackers, peanut butter, and ginger ale. Pt denies any complaints and states his headache is much better after the tylenol. Lights dimmed, TV on, call bell within reach. Will continue to monitor. No acute changes noted.

## 2021-07-18 NOTE — ED Notes (Signed)
Heparin infusing. Labs to be drawn at 2300 with heparin level.

## 2021-07-18 NOTE — ED Notes (Signed)
[facility] at bedside 

## 2021-07-18 NOTE — ED Notes (Signed)
Pt to MRI

## 2021-07-18 NOTE — ED Notes (Signed)
Remains in MRI, not in room

## 2021-07-18 NOTE — ED Notes (Signed)
Lack of q30 neuro assessments d/t to primary RN involvement in code STEMI and cardiac arrest

## 2021-07-18 NOTE — H&P (Signed)
History and Physical    Shane Martin [name] DOB: 02-25-1976 DOA: 07/18/2021  PCP: Darrow Bussing, MD Consultants:  Patient coming from:  Home - lives with wife.   Chief Complaint: headache   HPI: Shane Martin is a 45 y.o. male with medical history significant of asthma, and DVT who presented to ED with complaints of a headache x 5-6 days. He states the headache was only on the right side of his head. He states it was worrying because he never gets a headache. Worse when he bent over and had pressure behind his right eye. Also has a stiff neck. Saw a virtual urgent care who thought sinus headache, but gave him precautions. He wasn't getting better, so came to ER. Headache was rated as a 7/10 at it's worse. Pain described as dull with some intermittent sharpness. No vision changes or focal deficits. He did feel like this was the worst headache of his life. Never woke him up from his sleep. Ibuprofen completely took headache away.   He had his covid booster about 2 weeks ago. He does not smoke.   Has history of DVT about 6 years in [place]. He broke his ankle and was put in a pneumatic boot and laid up in bed and found to have the blood clot. He saw a hematologist and had a work up done and he had some sort of genetic predisposition, but didn't require long term anticoagulation. He was treated with eliquis for 6 months.   Denies fevers chills, vision changes, chest pain or palpitations, shortness of breath or cough, abdominal pain, nausea vomiting or diarrhea, dysuria or other urinary symptoms  ED Course: vitals: Afebrile, blood pressure 138/90, heart rate 84, respiratory rate 16, oxygen 98% on room air. Pertinent labs: WBC 11.9, covid negative,  CTH: Daily suspicious for dural venous sinus thrombosis.  Also suggestion of subtle hyperdensity in the right occipital lobe region with trace subarachnoid hemorrhage or parenchymal hemorrhage/hemorrhagic infarction. MRI brain: Extensive thrombosis  of the right transverse sinus through the sigmoid sinus and into the upper right internal jugular sinus.  No associated hemorrhage. In ED neurology was consulted.  Patient was placed on heparin drip.  TRH was asked to admit.  Review of Systems: As per HPI; otherwise review of systems reviewed and negative.   Ambulatory Status:  Ambulates without assistance   Past Medical History:  Diagnosis Date   Anxiety    Asthma    DVT (deep venous thrombosis) (HCC)    Elevated LFTs    Vertigo     Past Surgical History:  Procedure Laterality Date   No prior surgery      Social History   Socioeconomic History   Marital status: Married    Spouse name: Not on file   Number of children: Not on file   Years of education: Not on file   Highest education level: Not on file  Occupational History   Not on file  Tobacco Use   Smoking status: Never    Passive exposure: Never   Smokeless tobacco: Never  Vaping Use   Vaping Use: Never used  Substance and Sexual Activity   Alcohol use: No   Drug use: No   Sexual activity: Not on file  Other Topics Concern   Not on file  Social History Narrative   Not on file   Social Determinants of Health   Financial Resource Strain: Not on file  Food Insecurity: Not on file  Transportation Needs: Not on file  Physical Activity: Not on file  Stress: Not on file  Social Connections: Not on file  Intimate Partner Violence: Not on file    Allergies  Allergen Reactions   Cats Claw (Uncaria Tomentosa) Shortness Of Breath    Family History  Problem Relation Age of Onset   CAD Father     Prior to Admission medications   Medication Sig Start Date End Date Taking? Authorizing Provider  cholecalciferol (VITAMIN D) 1000 units tablet Take 1,000 Units by mouth daily.    [provider]  fluticasone (FLONASE) 50 MCG/ACT nasal spray Place into both nostrils as directed.    [provider]  vitamin B-12 (CYANOCOBALAMIN) 1000 MCG tablet  Take 3,000 mcg by mouth daily.    [provider]    Physical Exam: Vitals:   07/18/21 1642 07/18/21 1645 07/18/21 1700 07/18/21 1715  BP: (!) 145/86 134/87 139/88 136/83  Pulse: 92 97 92 78  Resp:  (!) 22 (!) 7 17  Temp:      TempSrc:      SpO2: 98% 98% 98% 97%  Weight:      Height:         General:  Appears calm and comfortable and is in NAD Eyes:  PERRL, EOMI, normal lids, iris ENT:  grossly normal hearing, lips & tongue, mmm; appropriate dentition Neck:  no LAD, masses or thyromegaly; no carotid bruits Cardiovascular:  RRR, no m/r/g. No LE edema.  Respiratory:   CTA bilaterally with no wheezes/rales/rhonchi.  Normal respiratory effort. Abdomen:  soft, NT, ND, NABS Back:   normal alignment, no CVAT Skin:  no rash or induration seen on limited exam Musculoskeletal:  grossly normal tone BUE/BLE, good ROM, no bony abnormality Lower extremity:  No LE edema.  Limited foot exam with no ulcerations.  2+ distal pulses. Psychiatric:  grossly normal mood and affect, speech fluent and appropriate, AOx3 Neurologic:  CN 2-12 grossly intact, moves all extremities in coordinated fashion, sensation intact. DTR 2+    Radiological Exams on Admission: Independently reviewed - see discussion in A/P where applicable  CT Head Wo Contrast  Result Date: 07/18/2021 CLINICAL DATA:  Provided history: Severe headache. Additional history provided: Patient reports head pain for 6 days, nausea and severe right-sided neck stiffness. EXAM: CT HEAD WITHOUT CONTRAST TECHNIQUE: Contiguous axial images were obtained from the base of the skull through the vertex without intravenous contrast. COMPARISON:  No pertinent prior exams available for comparison. FINDINGS: Brain: Cerebral volume is normal for age. There is subtle ill-defined hyperdensity in the right occipital lobe region, which may reflect trace subarachnoid hemorrhage or parenchymal hemorrhage/hemorrhagic infarction (for instance as seen on  series 2, image 17) (series 4, image 14) (series 5, image 18). No evidence of an intracranial mass. No midline shift. Vascular: Asymmetric hyperdensity of the right transverse and sigmoid dural venous sinuses, suspicious for dural venous sinus thrombosis. Skull: Normal. Negative for fracture or focal lesion. Sinuses/Orbits: Visualized orbits show no acute finding. Trace mucosal thickening within the bilateral ethmoid air cells. These results were called by telephone at the time of interpretation on 07/18/2021 at 12:07 pm to provider Tanda Rockers , who verbally acknowledged these results. IMPRESSION: There is asymmetric hyperdensity of the right transverse and sigmoid dural venous sinuses, highly suspicious for dural venous sinus thrombosis. Additionally, there is suggestion of subtle hyperdensity in the right occipital lobe region, which trace subarachnoid hemorrhage or parenchymal hemorrhage/hemorrhagic infarction. An MRI brain and MRV head without and with contrast are recommended for further  evaluation. Electronically Signed   By: Jackey Loge D.O.   On: 07/18/2021 12:10   MR BRAIN WO CONTRAST  Result Date: 07/18/2021 CLINICAL DATA:  Headache. Personal history of DVT. Dural venous sinus thrombosis suspected. EXAM: MRI HEAD WITHOUT CONTRAST MR VENOGRAM HEAD WITHOUT AND WITH CONTRAST TECHNIQUE: Multiplanar, multi-echo pulse sequences of the brain and surrounding structures were acquired without and with intravenous contrast. Angiographic images of the intracranial venous structures were acquired using MRV technique without and with intravenous contrast. CONTRAST:  9.57mL GADAVIST GADOBUTROL 1 MMOL/ML IV SOLN COMPARISON:  CT head without contrast 07/18/2021 FINDINGS: MRI HEAD WITHOUT AND WITH CONTRAST Brain: Diffusion-weighted images demonstrate no acute infarct. No acute hemorrhage or mass lesion is present. No significant white matter lesions are present. The ventricles are of normal size. No significant  extraaxial fluid collection is present. The internal auditory canals are within normal limits. The brainstem and cerebellum are within normal limits Vascular: Marked susceptibility artifact is evident adjacent to the right transverse sinus. Flow is present in the major intracranial arteries. Skull and upper cervical spine: The craniocervical junction is normal. Upper cervical spine is within normal limits. Marrow signal is unremarkable. Sinuses/Orbits: Small amount of fluid is present in the mastoid air cells bilaterally. No obstructing nasopharyngeal lesion is present. The paranasal sinuses and mastoid air cells are otherwise clear. The globes and orbits are within normal limits. MR VENOGRAM HEAD WITHOUT AND WITH CONTRAST Pre and postcontrast images demonstrate extensive thrombosis in the right transverse sinus through the sigmoid sinus and into the upper right internal jugular sinus. No associated hemorrhage. The dural sinuses are otherwise patent. The left transverse sinus is patent. IMPRESSION: 1. Extensive thrombosis of the right transverse sinus through the sigmoid sinus and into the upper right internal jugular sinus. 2. No associated hemorrhage. 3. Normal MRI appearance of the brain. 4. Small amount of fluid in the mastoid air cells bilaterally. Electronically Signed   By: Marin Roberts M.D.   On: 07/18/2021 16:54   MR Venogram Head  Result Date: 07/18/2021 CLINICAL DATA:  Headache. Personal history of DVT. Dural venous sinus thrombosis suspected. EXAM: MRI HEAD WITHOUT CONTRAST MR VENOGRAM HEAD WITHOUT AND WITH CONTRAST TECHNIQUE: Multiplanar, multi-echo pulse sequences of the brain and surrounding structures were acquired without and with intravenous contrast. Angiographic images of the intracranial venous structures were acquired using MRV technique without and with intravenous contrast. CONTRAST:  9.76mL GADAVIST GADOBUTROL 1 MMOL/ML IV SOLN COMPARISON:  CT head without contrast 07/18/2021  FINDINGS: MRI HEAD WITHOUT AND WITH CONTRAST Brain: Diffusion-weighted images demonstrate no acute infarct. No acute hemorrhage or mass lesion is present. No significant white matter lesions are present. The ventricles are of normal size. No significant extraaxial fluid collection is present. The internal auditory canals are within normal limits. The brainstem and cerebellum are within normal limits Vascular: Marked susceptibility artifact is evident adjacent to the right transverse sinus. Flow is present in the major intracranial arteries. Skull and upper cervical spine: The craniocervical junction is normal. Upper cervical spine is within normal limits. Marrow signal is unremarkable. Sinuses/Orbits: Small amount of fluid is present in the mastoid air cells bilaterally. No obstructing nasopharyngeal lesion is present. The paranasal sinuses and mastoid air cells are otherwise clear. The globes and orbits are within normal limits. MR VENOGRAM HEAD WITHOUT AND WITH CONTRAST Pre and postcontrast images demonstrate extensive thrombosis in the right transverse sinus through the sigmoid sinus and into the upper right internal jugular sinus. No associated hemorrhage. The dural sinuses  are otherwise patent. The left transverse sinus is patent. IMPRESSION: 1. Extensive thrombosis of the right transverse sinus through the sigmoid sinus and into the upper right internal jugular sinus. 2. No associated hemorrhage. 3. Normal MRI appearance of the brain. 4. Small amount of fluid in the mastoid air cells bilaterally. Electronically Signed   By: Marin Roberts M.D.   On: 07/18/2021 16:54    EKG: Independently reviewed.  NSR with rate 75; nonspecific ST changes with no evidence of acute ischemia   Labs on Admission: I have personally reviewed the available labs and imaging studies at the time of the admission.  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 Etiology unclear but differential includes covid vaccine, genetic etiology.  -Admit to progressive -Neurology consulted and following and appreciate all input -Frequent neurochecks -Neurology has ordered a hypercoagulable work-up, echocardiogram lipid panel and A1c -Started on IV heparin.  If tolerates will switch to DOAC in 2 days. -Hematology follow-up as outpatient  Active Problems: Leukocytosis Likely reactive. Has no fever or other complains of viral/bacterial illness Continue to follow cbc/fever curve    History of DVT (deep vein thrombosis) -sounds provoked with injury -hypercoag w/u. Tolerated eliquis well in the past.   Exercise induced asthma  SABA prn   Body mass index is 29.13 kg/m.   Level of care: Progressive DVT prophylaxis:  heparin gtt Code Status:  Full - confirmed with patient/ Family Communication: None present; I spoke with the patient's husband by telephone at the time of admission. Disposition Plan:  The patient is from: home  Anticipated d/c is to: home   Requires inpatient hospitalization and is at significant risk of neurological worsening, requires constant monitoring, assessment and MDM with specialists.    Patient is currently: stable  Consults called: neurology  Admission status:  inpatient   Dragon dictation used in completing this note.    Orland Mustard MD [facility]   How to contact the [facility] Attending or Consulting provider 7A - 7P or covering provider during after hours 7P -7A, for this patient?  Check the care team in [facility] and look for a) attending/consulting TR[facility] provider listed and b) the [facility] team listed Log into [email or web address] and use [place]'s universal password to access. If you do not have the password, please contact the hospital operator. Locate the [facility] provider you are looking for under  [facility] and page to a number that you can be directly reached. If you still have difficulty reaching the provider, please page the [facility] (Director on Call) for the Hospitalists listed on amion for assistance.   07/18/2021, 5:54 PM

## 2021-07-18 NOTE — Progress Notes (Signed)
ANTICOAGULATION CONSULT NOTE - Initial Consult  Pharmacy Consult for heparin Indication: dural venous sinus thrombus  Allergies  Allergen Reactions   Cats Claw (Uncaria Tomentosa) Shortness Of Breath    Patient Measurements: Height: 5\' 10"  (177.8 cm) Weight: 92.1 kg (203 lb) IBW/kg (Calculated) : 73 Heparin Dosing Weight: 91 kg  Vital Signs: Temp: 98.5 F (36.9 C) (10/30 1357) Temp Source: Oral (10/30 1357) BP: 134/87 (10/30 1645) Pulse Rate: 97 (10/30 1645)  Labs: Recent Labs    07/18/21 1027 07/18/21 1028  HGB  --  16.0  HCT  --  46.0  PLT  --  255  LABPROT 12.2  --   INR 0.9  --   CREATININE  --  0.88    Estimated Creatinine Clearance: 123.4 mL/min (by C-G formula based on SCr of 0.88 mg/dL).   Medical History: Past Medical History:  Diagnosis Date   Anxiety    Asthma    DVT (deep venous thrombosis) (HCC)    Elevated LFTs    Vertigo     Medications: see MAR  Assessment: 45 yo M with headache x 5 days. Found to have dural venous sinus thrombus - also found R trace SAH. Prior hx of DVT - but no AC PTA now. Heparin consult for thrombus.   Baseline CBC ok. Given trace SAH - no bolus for heparin and lowering goal and starting gtt at lower dose too.   Goal of Therapy:  Heparin level 0.3-0.5 units/ml Monitor platelets by anticoagulation protocol: Yes   Plan:  Start heparin infusion at 1000 units/hr Check anti-Xa level in 6 hours and daily while on heparin Continue to monitor H&H and platelets  57, PharmD, [facility] Emergency Medicine Clinical Pharmacist ED RPh Phone: [phone] Main RX: [phone]

## 2021-07-18 NOTE — Progress Notes (Signed)
Neurology telephone note  Contacted by Dr. Tanda Rockers about this 45 yo patient who presented today to [facility] c/o 6/10 headache x5 days. Neurologic exam benign. CT head suspicicious for dural venous sinus thrombis and subtle R occipital hyperintensity suggestive of IPH vs trace SAH. Prior hx of DVT, no longer on anticoagulation. SBP stable 130s-150s.  Recommendations:  - STAT ED to ED transfer to [facility] - Clevidipine for goal SBP <150 - Patient not on anticoagulation, nothing to reverse - HOB elevated 30 degrees - Hypertonic saline 3% continuous PIV per protocol - Further imaging and mgmt per [facility] neurohospitalist  D/w Dr. Tanda Rockers [facility] DB EDP and Dr. Marchelle Folks [facility] neurohospitalist by phone. Pls page Dr. Derry Lory upon patient's arrival to [facility].    Bing Neighbors, MD [facility] [phone]   If 7pm- 7am, please page neurology on call as listed in AMION.

## 2021-07-18 NOTE — ED Notes (Signed)
Admitting Dr. Artis Flock remains at [facility]. No changes. Alert, NAD, calm, interactive. Denies HA or other sx at this time. Heparin infusing.

## 2021-07-18 NOTE — Consult Note (Signed)
Neurology Consultation  Reason for Consult: ? Dural venous sinus thrombosis and trace SAH.  Referring Physician: M. Trifan, MD.   CC: HA x 5 days.   History is obtained from: patient, chart.   HPI: Jael Kostick is a 45 y.o. male with a PMHx of anxiety, asthma, DVT, transaminitis, and vertigo who presented to ED 3 hours ago with c/o HA x 5 days accompanied by n/v. He described the HA as constant, worsening, achy, sharp at times, and throbbing. He initially thought it was "sinus" and took Ibuprofen with mild improvement. He has not recently had a fever, chills, diarrhea, viral illness, cold, or COVID. Associated symptoms include neck pain.   Workup thus far shows slight leukocytosis with normal LA normal and INR 0.9.  Patient had a CTH which showed asymmetric hyperdensity of right transverse and sigmoid dural venous sinuses, suspicious for a dural venous sinus thrombosis. There is suggestion of possible SAH.   MRI reported no bleed, but + for dural venous thrombosis.   In re: DVT 5 years ago. Was on Eliquis and tolerated well.   ROS: A robust ROS was performed and is negative except as noted in the HPI.   Past Medical History:  Diagnosis Date   Anxiety    Asthma    DVT (deep venous thrombosis) (HCC)    Elevated LFTs    Vertigo    Family History  Problem Relation Age of Onset   CAD Father    Social History:   reports that he has never smoked. He has never been exposed to tobacco smoke. He has never used smokeless tobacco. He reports that he does not drink alcohol and does not use drugs.  Medications No current facility-administered medications for this encounter.  Current Outpatient Medications:    cholecalciferol (VITAMIN D) 1000 units tablet, Take 1,000 Units by mouth daily., Disp: , Rfl:    fluticasone (FLONASE) 50 MCG/ACT nasal spray, Place into both nostrils as directed., Disp: , Rfl:    vitamin B-12 (CYANOCOBALAMIN) 1000 MCG tablet, Take 3,000 mcg by mouth daily., Disp:  , Rfl:   Exam: Current vital signs: BP 120/89   Pulse 92   Temp 98.5 F (36.9 C) (Oral)   Resp 18   Ht '5\' 10"'  (1.778 m)   Wt 92.1 kg   SpO2 98%   BMI 29.13 kg/m  Vital signs in last 24 hours: Temp:  [98.1 F (36.7 C)-98.5 F (36.9 C)] 98.5 F (36.9 C) (10/30 1357) Pulse Rate:  [81-113] 92 (10/30 1515) Resp:  [14-22] 18 (10/30 1515) BP: (120-159)/(85-99) 120/89 (10/30 1515) SpO2:  [97 %-100 %] 98 % (10/30 1515) Weight:  [92.1 kg] 92.1 kg (10/30 1234)  PE: GENERAL: Well appearing male. Awake, alert in NAD.  HEENT: normocephalic and atraumatic. Neck is tender.  LUNGS - Normal respiratory effort.  CV - RRR on tele. ABDOMEN - Soft, nontender. Ext: warm, well perfused. Psych: affect light.    NEURO:  Mental Status: Alert and oriented x4. Follows commands.  Speech/Language: speech is without dysarthria or aphasia.  Naming, repetition, fluency, and comprehension intact.  Cranial Nerves:  II: PERRL  mm/brisk. visual fields full.  III, IV, VI: EOMI. Lid elevation symmetric and full.  V: sensation is intact and symmetrical to face.  VII: Smile is symmetrical. Able to puff cheeks and raise eyebrows.  VIII:hearing intact to voice. IX, X: palate elevation is symmetric. Phonation normal.  XI: normal sternocleidomastoid and trapezius muscle strength. XVQ:MGQQPY is symmetrical without fasciculations.   Motor:  5/5 strength throughout.  Tone is normal. Bulk is normal.  Sensation- Intact to light touch bilaterally in all four extremities. Extinction absent to DSS.  Coordination: FTN intact bilaterally. HKS intact bilaterally. No pronator drift.  DTRs:  2+ throughout.  Gait- deferred.  NIHSS: 0  Labs I have reviewed labs in epic and the results pertinent to this consultation are: INR 0.9. PT 12.2.   CBC    Component Value Date/Time   WBC 11.9 (H) 07/18/2021 1028   RBC 5.53 07/18/2021 1028   HGB 16.0 07/18/2021 1028   HCT 46.0 07/18/2021 1028   PLT 255 07/18/2021 1028    MCV 83.2 07/18/2021 1028   MCH 28.9 07/18/2021 1028   MCHC 34.8 07/18/2021 1028   RDW 13.2 07/18/2021 1028   LYMPHSABS 2.0 07/18/2021 1028   MONOABS 0.8 07/18/2021 1028   EOSABS 0.1 07/18/2021 1028   BASOSABS 0.1 07/18/2021 1028   CMP     Component Value Date/Time   NA 139 07/18/2021 1028   K 3.9 07/18/2021 1028   CL 100 07/18/2021 1028   CO2 29 07/18/2021 1028   GLUCOSE 125 (H) 07/18/2021 1028   BUN 14 07/18/2021 1028   CREATININE 0.88 07/18/2021 1028   CALCIUM 9.2 07/18/2021 1028   PROT 8.0 07/18/2021 1028   ALBUMIN 4.4 07/18/2021 1028   AST 27 07/18/2021 1028   ALT 45 (H) 07/18/2021 1028   ALKPHOS 62 07/18/2021 1028   BILITOT 0.8 07/18/2021 1028   GFRNONAA >60 07/18/2021 1028   GFRAA >60 06/29/2017 0115   Imaging MD reviewed the images obtained.  CT head There is asymmetric hyperdensity of the right transverse and sigmoid dural venous sinuses, highly suspicious for dural venous sinus thrombosis. Additionally, there is suggestion of subtle hyperdensity in the right occipital lobe region, which trace subarachnoid hemorrhage or parenchymal hemorrhage/hemorrhagic infarction.  MRIb and MRV -Extensive thrombosis of the right transverse sinus through the sigmoid sinus and into the upper right internal jugular sinus. -No associated hemorrhage. -Normal MRI appearance of the brain. -Small amount of fluid in the mastoid air cells bilaterally.   Assessment: 45 yo male who presented with 5 day HA, unrelieved by Ibuprofen. A CTH was suggestive of dural venous sinus thrombosis and possible SAH. On rare occasion, venous sinuses may be asymmetic, so further imaging was ordered. His MRI/MRV confirmed thrombosis and no bleed. Patient will be started on Heparin gtt without bolus and transitioned to Eliquis. His HA can be explained by the increased ICP due to lack of CSF outflow. Will do coagulation workup given his past DVT and now thrombus.   Recommendations/Plan:  -Start Heparin gtt  per pharmacy consult. NO BOLUS.  -Will transition to Eliquis after a few days.   -aPTT, ESR, ANA IFA, antiphospholipid, antithrombin III, Factor V Leiden, MTHFR DNA, Protein C and Protein S activity.  -Stroke team to follow.   Pt seen by Clance Boll, NP/Neuro and later by MD. Note/plan to be edited by MD as needed.  Pager: [phone]  ATTENDING NOTE: I reviewed above note and agree with the assessment and plan. Pt was seen and examined.   45 year old male with history of right LE DVT 5 years ago on Eliquis for 6 months, HLD presenting to ED for headache x 5 days.  He stated that for the last 4 to 5 days, he started to have headache more on the right side, more at the back of the head, with bending over or coughing making headache getting worse.  He did  not come to the hospital in the beginning, however headache getting worse and worse, trigger him seeking medical attention in ER.  CT head concerning for right transverse and sigmoid sinus venous thrombosis.  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  Per patient, 5 years ago, he had right leg ankle injury in [place] which resulted right leg DVT, he was treated with Eliquis for 6 months.  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  Since then, he moved to [facility] and has not follow-up with oncology.  His brother had PE but associate with malignancy.  He is not on any prescription drugs.  He denies any smoking, alcohol or illicit drugs.  He also stated that he had Moderna bi-valent booster vaccine 2 weeks ago.   On exam, patient awake alert, orientated x3, no aphasia, follows simple commands.  Neurologically intact, no focal deficit. Etiology for his cerebral sinus venous thrombosis not quite clear, may related to his DVT history, " genetic  predisposition" and recent COVID booster vaccination.  We will check hypercoagulable labs, start heparin IV for CSVT treatment.  Neuro check.  Admit to internal medicine and we will follow Frequent neuro checks Hypercoagulable work-up Echocardiogram  UDS, fasting lipid panel and HgbA1C Heparin IV for CSVT treatment - if tolerate well in 2 days, will switch to DOAC Discussed with Dr. Langston Masker ED physician Will need oncology/hematology follow-up as outpatient We will follow  For detailed assessment and plan, please refer to above as I have made changes wherever appropriate.   Rosalin Hawking, MD PhD Stroke Neurology 07/18/2021 5:26 PM

## 2021-07-18 NOTE — ED Notes (Signed)
Back from MRI. Vasc surg at [facility]. HA resolved after tylenol. Denies pain. No changes. Alert, NAD, calm, interactive. Wife at [facility].

## 2021-07-18 NOTE — ED Provider Notes (Addendum)
[facility] EMERGENCY DEPT Provider Note   CSN: [id number] Arrival date & time: 07/18/21  7035     History Chief Complaint  Patient presents with   Headache   Neck Pain    Shane Martin is a 45 y.o. male.  This is a 45 y.o. male with significant medical history as below, including prior DVT, no longer on [facility] who presents to the ED with complaint of headache.   Location: Right-sided headache, neck pain Duration: 5 days Onset: Gradual Timing: Constant, worsening Description: Aching, sharp, throbbing Severity: Mild Exacerbating/Alleviating Factors: Improved with Motrin Associated Symptoms: Right-sided tinnitus, right sided neck pain Pertinent Negatives:  No photophobia or phonophobia, no fevers, chills, vision changes, no trauma  Context: Patient reports gradual onset right-sided headache over the past 5 days.  Initially began as what he thought was a sinus headache, not improving.  Took Motrin with mild improvement.  Pain initially to his right side forehead then began rating to his right side of his neck.  No fevers, chills, IV drug use   The history is provided by the patient and the spouse. No language interpreter was used.  Headache Associated symptoms: neck pain   Associated symptoms: no abdominal pain, no cough, no eye pain, no fever, no nausea, no numbness, no photophobia, no seizures and no vomiting   Neck Pain Associated symptoms: headaches   Associated symptoms: no chest pain, no fever, no numbness and no photophobia       Past Medical History:  Diagnosis Date   Anxiety    Asthma    DVT (deep venous thrombosis) (HCC)    Elevated LFTs    Vertigo     Patient Active Problem List   Diagnosis Date Noted   Anxiety    Asthma    DVT (deep venous thrombosis) (HCC)    Vertigo     Past Surgical History:  Procedure Laterality Date   No prior surgery         Family History  Problem Relation Age of Onset   CAD Father     Social History    Tobacco Use   Smoking status: Never    Passive exposure: Never   Smokeless tobacco: Never  Vaping Use   Vaping Use: Never used  Substance Use Topics   Alcohol use: No   Drug use: No    Home Medications Prior to Admission medications   Medication Sig Start Date End Date Taking? Authorizing Provider  cholecalciferol (VITAMIN D) 1000 units tablet Take 1,000 Units by mouth daily.    [provider]  fluticasone (FLONASE) 50 MCG/ACT nasal spray Place into both nostrils as directed.    [provider]  vitamin B-12 (CYANOCOBALAMIN) 1000 MCG tablet Take 3,000 mcg by mouth daily.    [provider]    Allergies    Cats claw (uncaria tomentosa)  Review of Systems   Review of Systems  Constitutional:  Negative for chills and fever.  HENT:  Negative for facial swelling and trouble swallowing.        Right ear ringing   Eyes:  Negative for photophobia, pain, redness and visual disturbance.  Respiratory:  Negative for cough and shortness of breath.   Cardiovascular:  Negative for chest pain and palpitations.  Gastrointestinal:  Negative for abdominal pain, nausea and vomiting.  Endocrine: Negative for polydipsia and polyuria.  Genitourinary:  Negative for difficulty urinating and hematuria.  Musculoskeletal:  Positive for neck pain. Negative for gait problem and joint swelling.  Skin:  Negative for pallor and rash.  Neurological:  Positive for headaches. Negative for seizures, syncope and numbness.  Psychiatric/Behavioral:  Negative for agitation and confusion.    Physical Exam Updated Vital Signs BP 136/83   Pulse 78   Temp 98.5 F (36.9 C) (Oral)   Resp 17   Ht 5\' 10"  (1.778 m)   Wt 92.1 kg   SpO2 97%   BMI 29.13 kg/m   Physical Exam Vitals and nursing note reviewed.  Constitutional:      General: He is not in acute distress.    Appearance: He is well-developed.  HENT:     Head: Normocephalic and atraumatic.     Right Ear: External ear  normal.     Left Ear: External ear normal.     Mouth/Throat:     Mouth: Mucous membranes are moist.  Eyes:     General: Vision grossly intact. Gaze aligned appropriately. No visual field deficit or scleral icterus.    Extraocular Movements: Extraocular movements intact.     Right eye: Normal extraocular motion.     Left eye: Normal extraocular motion.     Pupils: Pupils are equal, round, and reactive to light.     Comments: Pupils 67mm b/l briskly reactive  Cardiovascular:     Rate and Rhythm: Normal rate and regular rhythm.     Pulses: Normal pulses.     Heart sounds: Normal heart sounds.  Pulmonary:     Effort: Pulmonary effort is normal. No respiratory distress.     Breath sounds: Normal breath sounds.  Abdominal:     General: Abdomen is flat.     Palpations: Abdomen is soft.     Tenderness: There is no abdominal tenderness.  Musculoskeletal:        General: Normal range of motion.     Cervical back: Normal range of motion.     Right lower leg: No edema.     Left lower leg: No edema.  Skin:    General: Skin is warm and dry.     Capillary Refill: Capillary refill takes less than 2 seconds.  Neurological:     Mental Status: He is alert and oriented to person, place, and time.     GCS: GCS eye subscore is 4. GCS verbal subscore is 5. GCS motor subscore is 6.     Cranial Nerves: Cranial nerves 2-12 are intact. No facial asymmetry.     Sensory: Sensation is intact.     Motor: Motor function is intact. No weakness.     Coordination: Coordination is intact. Finger-Nose-Finger Test normal.     Gait: Gait is intact. Gait normal.  Psychiatric:        Mood and Affect: Mood normal.        Behavior: Behavior normal.    ED Results / Procedures / Treatments   Labs (all labs ordered are listed, but only abnormal results are displayed) Labs Reviewed  COMPREHENSIVE METABOLIC PANEL - Abnormal; Notable for the following components:      Result Value   Glucose, Bld 125 (*)    ALT 45  (*)    All other components within normal limits  CBC WITH DIFFERENTIAL/PLATELET - Abnormal; Notable for the following components:   WBC 11.9 (*)    Neutro Abs 8.8 (*)    All other components within normal limits  RESP PANEL BY RT-PCR (FLU A&B, COVID) ARPGX2  LACTIC ACID, PLASMA  PROTIME-INR  HEPARIN LEVEL (UNFRACTIONATED)  HEPARIN LEVEL (UNFRACTIONATED)  CBC  ANTIPHOSPHOLIPID SYNDROME EVAL, BLD  ANTITHROMBIN III  C-REACTIVE PROTEIN  ANTI-DNA ANTIBODY, DOUBLE-STRANDED  FACTOR 5 LEIDEN  MTHFR DNA ANALYSIS  PROTEIN C ACTIVITY  PROTEIN S ACTIVITY  SEDIMENTATION RATE  ANTINUCLEAR ANTIBODIES, IFA    EKG None  Radiology CT Head Wo Contrast  Result Date: 07/18/2021 CLINICAL DATA:  Provided history: Severe headache. Additional history provided: Patient reports head pain for 6 days, nausea and severe right-sided neck stiffness. EXAM: CT HEAD WITHOUT CONTRAST TECHNIQUE: Contiguous axial images were obtained from the base of the skull through the vertex without intravenous contrast. COMPARISON:  No pertinent prior exams available for comparison. FINDINGS: Brain: Cerebral volume is normal for age. There is subtle ill-defined hyperdensity in the right occipital lobe region, which may reflect trace subarachnoid hemorrhage or parenchymal hemorrhage/hemorrhagic infarction (for instance as seen on series 2, image 17) (series 4, image 14) (series 5, image 18). No evidence of an intracranial mass. No midline shift. Vascular: Asymmetric hyperdensity of the right transverse and sigmoid dural venous sinuses, suspicious for dural venous sinus thrombosis. Skull: Normal. Negative for fracture or focal lesion. Sinuses/Orbits: Visualized orbits show no acute finding. Trace mucosal thickening within the bilateral ethmoid air cells. These results were called by telephone at the time of interpretation on 07/18/2021 at 12:07 pm to provider Tanda Rockers , who verbally acknowledged these results. IMPRESSION: There is  asymmetric hyperdensity of the right transverse and sigmoid dural venous sinuses, highly suspicious for dural venous sinus thrombosis. Additionally, there is suggestion of subtle hyperdensity in the right occipital lobe region, which trace subarachnoid hemorrhage or parenchymal hemorrhage/hemorrhagic infarction. An MRI brain and MRV head without and with contrast are recommended for further evaluation. Electronically Signed   By: Jackey Loge D.O.   On: 07/18/2021 12:10  Procedures .Critical Care Performed by: Sloan Leiter, DO Authorized by: Sloan Leiter, DO   Critical care provider statement:    Critical care time (minutes):  32   Critical care time was exclusive of:  Separately billable procedures and treating other patients   Critical care was necessary to treat or prevent imminent or life-threatening deterioration of the following conditions:  CNS failure or compromise   Critical care was time spent personally by me on the following activities:  Ordering and performing treatments and interventions, ordering and review of laboratory studies, ordering and review of radiographic studies, pulse oximetry, re-evaluation of patient's condition, review of old charts, obtaining history from patient or surrogate, examination of patient, evaluation of patient's response to treatment and development of treatment plan with patient or surrogate Comments:     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    Medications Ordered in ED Medications  heparin ADULT infusion 100 units/mL (25000 units/248mL) (1,000 Units/hr Intravenous New Bag/Given 07/18/21 1718)  acetaminophen (TYLENOL) tablet 650 mg (650 mg Oral Given 07/18/21 1513)  gadobutrol (GADAVIST) 1 MMOL/ML injection 9.2 mL (9.2 mLs Intravenous Contrast Given 07/18/21 1615)    ED Course  I have reviewed the triage vital signs and the nursing notes.  Pertinent labs & imaging results that were available during my  care of the patient were reviewed by me and considered in my medical decision making (see chart for details).     MDM Rules/Calculators/A&P                           CC: Headache, neck pain to the right  This patient complains of above; this involves  an extensive number of treatment options and is a complaint that carries with it a high risk of complications and morbidity. Vital signs were reviewed. Serious etiologies considered.  Patient with nontraumatic headache, received CT imaging concerning for possible dural venous sinus thrombosis, also possible subarachnoid or parenchymal hemorrhage to the right occipital lobe region.  D/w radiologist Dr Renette Butters, recommends MRI, MRV.  Record review:  Previous records obtained and reviewed   Work up as above, notable for:  Labs & imaging results that were available during my care of the patient were reviewed by me and considered in my medical decision making.   I ordered imaging studies which included CT H and I independently visualized and interpreted imaging which showed findings as above  Reassessment:  No seizure activity, neuro exam is non-focal. EOMI. No visual field cuts or deficit. NIHSS 0. Seizure precautions ordered.   [facility], neuro eval.     D/w Dr Selina Cooley, recommends transfer to [facility] for further imaging and work-up.  Will transfer ED to ED.  Discussed with Dr  Rush Landmark who accepts patient to [facility] ED. Neuro requests page upon arrival.  SBP goal <150.      This chart was dictated using voice recognition software.  Despite best efforts to proofread,  errors can occur which can change the documentation meaning.  Final Clinical Impression(s) / ED Diagnoses Final diagnoses:  Nonintractable headache, unspecified chronicity pattern, unspecified headache type  Abnormal CT of brain    Rx / DC Orders ED Discharge Orders     None        Sloan Leiter, DO 07/18/21 1250    Sloan Leiter,  DO 07/18/21 1251    Sloan Leiter, DO 07/18/21 1724

## 2021-07-18 NOTE — ED Notes (Signed)
Pt NAD, a/ox4, c/o 6/10 headache that radiates from forehead to right head and neck, x 5-6 days. Pt states he normally does not get headaches at all as this is unusual for him. States took tylenol without relief. Pt denies dizziness, balance issues, visual disturbance. Gait steady, symmetry noted throughout. Speech clear.

## 2021-07-18 NOTE — ED Triage Notes (Signed)
Patient reports to the ER for head pain x6 days. Patient reports nausea and severe right sided neck stiffness. Patient states he was treating it like a viral illness and taking 800mg  advil to help with the pain. Patient reports he has been mostly bed bound and unable to do anything the last week.

## 2021-07-18 NOTE — ED Provider Notes (Signed)
Neurologist recommending IV heparin and medical admission for extensive venous thrombosis.  Patient updated regarding diagnosis and plan for admission and in agreement.   Terald Sleeper, MD 07/18/21 [phone]

## 2021-07-18 NOTE — ED Notes (Signed)
IM admitting MD at [facility]

## 2021-07-19 ENCOUNTER — Other Ambulatory Visit: Payer: Self-pay

## 2021-07-19 DIAGNOSIS — Z86718 Personal history of other venous thrombosis and embolism: Secondary | ICD-10-CM

## 2021-07-19 LAB — SEDIMENTATION RATE: Sed Rate: 7 mm/hr (ref 0–16)

## 2021-07-19 LAB — RAPID URINE DRUG SCREEN, HOSP PERFORMED
Amphetamines: NOT DETECTED
Barbiturates: NOT DETECTED
Benzodiazepines: NOT DETECTED
Cocaine: NOT DETECTED
Opiates: NOT DETECTED
Tetrahydrocannabinol: NOT DETECTED

## 2021-07-19 LAB — LIPID PANEL
Cholesterol: 202 mg/dL — ABNORMAL HIGH (ref 0–200)
HDL: 46 mg/dL (ref >40)
LDL Cholesterol: 115 mg/dL — ABNORMAL HIGH (ref 0–99)
Total CHOL/HDL Ratio: 4.4 RATIO
Triglycerides: 206 mg/dL — ABNORMAL HIGH (ref <150)
VLDL: 41 mg/dL — ABNORMAL HIGH (ref 0–40)

## 2021-07-19 LAB — CBC
HCT: 45.6 % (ref 39.0–52.0)
Hemoglobin: 15.7 g/dL (ref 13.0–17.0)
MCH: 28.9 pg (ref 26.0–34.0)
MCHC: 34.4 g/dL (ref 30.0–36.0)
MCV: 84 fL (ref 80.0–100.0)
Platelets: 269 10*3/uL (ref 150–400)
RBC: 5.43 MIL/uL (ref 4.22–5.81)
RDW: 13.3 % (ref 11.5–15.5)
WBC: 11.7 10*3/uL — ABNORMAL HIGH (ref 4.0–10.5)
nRBC: 0 % (ref 0.0–0.2)

## 2021-07-19 LAB — HIV ANTIBODY (ROUTINE TESTING W REFLEX): HIV Screen 4th Generation wRfx: NONREACTIVE

## 2021-07-19 LAB — HEMOGLOBIN A1C
Hgb A1c MFr Bld: 5.2 % (ref 4.8–5.6)
Mean Plasma Glucose: 102.54 mg/dL

## 2021-07-19 LAB — HEPARIN LEVEL (UNFRACTIONATED)
Heparin Unfractionated: <0.1 IU/mL — ABNORMAL LOW (ref 0.30–0.70)
Heparin Unfractionated: <0.1 IU/mL — ABNORMAL LOW (ref 0.30–0.70)
Heparin Unfractionated: 0.32 IU/mL (ref 0.30–0.70)
Heparin Unfractionated: 0.55 IU/mL (ref 0.30–0.70)

## 2021-07-19 LAB — C-REACTIVE PROTEIN: CRP: 2.1 mg/dL — ABNORMAL HIGH (ref <1.0)

## 2021-07-19 LAB — ANTITHROMBIN III: AntiThromb III Func: 112 % (ref 75–120)

## 2021-07-19 LAB — TSH: TSH: 3.864 u[IU]/mL (ref 0.350–4.500)

## 2021-07-19 LAB — VITAMIN B12: Vitamin B-12: 462 pg/mL (ref 180–914)

## 2021-07-19 MED ORDER — ATORVASTATIN CALCIUM 10 MG PO TABS
20.0000 mg | ORAL_TABLET | Freq: Every day | ORAL | Status: DC
  Filled 2021-07-19: qty 2

## 2021-07-19 NOTE — Progress Notes (Signed)
He PROGRESS NOTE  Marcial Pless DXA:[id number] DOB: 04/13/1976 DOA: 07/18/2021 PCP: Lujean Amel, MD   LOS: 1 day   Brief narrative:  Shane Martin is a 45 y.o. male with medical history significant of asthma, DVT presented hospital with 5 to 6 days history of headache. He had his patient will have a history of DVT 6 years ago in [place] and was seen by hematologist and was told that he did have some sort of genetic predisposition, but didn't require long term anticoagulation.  Treated with Eliquis for 6 months at that time.  On this admission patient was hemodynamically stable.  CT head was suspicious for dural venous sinus thrombosis which was confirmed by MRI of the brain which showed extensive thrombosis of the right transverse sinus through sigmoid sinus and  upper right internal jugular vein.  Neurology was consulted, heparin drip was initiated and the patient was admitted hospital.   Assessment/Plan:  Principal Problem:   Dural venous sinus thrombosis Active Problems:   Asthma   History of DVT (deep vein thrombosis)   Dural venous sinus thrombosis History of DVT in the past.  Possible genetic predisposition, recent COVID vaccination.  Extensive dural venous sinus thrombosis.  Neurology has ordered hypercoagulable work-up.  Continue IV heparin drip.  HIV nonreactive.  Lipid panel shows elevated cholesterol and triglyceride with LDL cholesterol of 115.  Hemoglobin A1c of 5.2.  TSH at 3.8.  Plan for switching to DOAC if IV heparin is tolerated.  Will need to follow-up with Hematology as outpatient.  ESR within normal limits.  TSH within normal limits.  An A1c of 5.2.  COVID and influenza was negative.  INR of 3.9.   Leukocytosis Likely reactive.  No signs of infection.  Follow CBC.     History of DVT  Thought to be secondary to injury in the past.  Currently hypercoagulable work-up underway.  DVT prophylaxis:   Heparin drip.   Code Status: Full code  Family Communication: Spoke  with the patient's wife at bedside.  Status is: Inpatient  Remains inpatient appropriate because: IV heparin drip, neurology follow-up.  Consultants: Neurology  Procedures: None  Anti-infectives:  None  Anti-infectives (From admission, onward)    None       Subjective: Today, patient was seen and examined at bedside.  Patient complains of 2/10 headache today.  Denies any vomiting or blurred vision.  Objective: Vitals:   07/19/21 0500 07/19/21 0645  BP: 112/76 112/86  Pulse: (!) 58 (!) 50  Resp: 18 14  Temp:    SpO2: 100% 96%    Intake/Output Summary (Last 24 hours) at 07/19/2021 0816 Last data filed at 07/18/2021 1739 Gross per 24 hour  Intake 500 ml  Output --  Net 500 ml   Filed Weights   07/18/21 1234  Weight: 92.1 kg   Body mass index is 29.13 kg/m.   Physical Exam:  GENERAL: Patient is alert awake and oriented. Not in obvious distress. HENT: No scleral pallor or icterus. Pupils equally reactive to light. Oral mucosa is moist NECK: is supple, no gross swelling noted. CHEST: Clear to auscultation. No crackles or wheezes.  Diminished breath sounds bilaterally. CVS: S1 and S2 heard, no murmur. Regular rate and rhythm.  ABDOMEN: Soft, non-tender, bowel sounds are present. EXTREMITIES: No edema. CNS: Cranial nerves are intact. No focal motor deficits. SKIN: warm and dry without rashes.  Data Review: I have personally reviewed the following laboratory data and studies,  CBC: Recent Labs  Lab 07/18/21  1028  WBC 11.9*  NEUTROABS 8.8*  HGB 16.0  HCT 46.0  MCV 83.2  PLT 701   Basic Metabolic Panel: Recent Labs  Lab 07/18/21 1028  NA 139  K 3.9  CL 100  CO2 29  GLUCOSE 125*  BUN 14  CREATININE 0.88  CALCIUM 9.2   Liver Function Tests: Recent Labs  Lab 07/18/21 1028  AST 27  ALT 45*  ALKPHOS 62  BILITOT 0.8  PROT 8.0  ALBUMIN 4.4   No results for input(s): LIPASE, AMYLASE in the last 168 hours. No results for input(s):  AMMONIA in the last 168 hours. Cardiac Enzymes: No results for input(s): CKTOTAL, CKMB, CKMBINDEX, TROPONINI in the last 168 hours. BNP (last 3 results) No results for input(s): BNP in the last 8760 hours.  ProBNP (last 3 results) No results for input(s): PROBNP in the last 8760 hours.  CBG: No results for input(s): GLUCAP in the last 168 hours. Recent Results (from the past 240 hour(s))  Resp Panel by RT-PCR (Flu A&B, Covid) Nasopharyngeal Swab     Status: None   Collection Time: 07/18/21 12:21 PM   Specimen: Nasopharyngeal Swab; Nasopharyngeal(NP) swabs in vial transport medium  Result Value Ref Range Status   SARS Coronavirus 2 by RT PCR NEGATIVE NEGATIVE Final    Comment: (NOTE) SARS-CoV-2 target nucleic acids are NOT DETECTED.  The SARS-CoV-2 RNA is generally detectable in upper respiratory specimens during the acute phase of infection. The lowest concentration of SARS-CoV-2 viral copies this assay can detect is 138 copies/mL. A negative result does not preclude SARS-Cov-2 infection and should not be used as the sole basis for treatment or other patient management decisions. A negative result may occur with  improper specimen collection/handling, submission of specimen other than nasopharyngeal swab, presence of viral mutation(s) within the areas targeted by this assay, and inadequate number of viral copies(<138 copies/mL). A negative result must be combined with clinical observations, patient history, and epidemiological information. The expected result is Negative.  Fact Sheet for Patients:  EntrepreneurPulse.com.au  Fact Sheet for Healthcare Providers:  IncredibleEmployment.be  This test is no t yet approved or cleared by the [place] FDA and  has been authorized for detection and/or diagnosis of SARS-CoV-2 by FDA under an Emergency Use Authorization (EUA). This EUA will remain  in effect (meaning this test can be used) for the  duration of the COVID-19 declaration under Section 564(b)(1) of the Act, 21 U.S.C.section 360bbb-3(b)(1), unless the authorization is terminated  or revoked sooner.       Influenza A by PCR NEGATIVE NEGATIVE Final   Influenza B by PCR NEGATIVE NEGATIVE Final    Comment: (NOTE) The Xpert Xpress SARS-CoV-2/FLU/RSV plus assay is intended as an aid in the diagnosis of influenza from Nasopharyngeal swab specimens and should not be used as a sole basis for treatment. Nasal washings and aspirates are unacceptable for Xpert Xpress SARS-CoV-2/FLU/RSV testing.  Fact Sheet for Patients: EntrepreneurPulse.com.au  Fact Sheet for Healthcare Providers: IncredibleEmployment.be  This test is not yet approved or cleared by the [place] FDA and has been authorized for detection and/or diagnosis of SARS-CoV-2 by FDA under an Emergency Use Authorization (EUA). This EUA will remain in effect (meaning this test can be used) for the duration of the COVID-19 declaration under Section 564(b)(1) of the Act, 21 U.S.C. section 360bbb-3(b)(1), unless the authorization is terminated or revoked.  Performed at [facility], [address], [place], [place] [address]      Studies: CT  Head Wo Contrast  Result Date: 07/18/2021 CLINICAL DATA:  Provided history: Severe headache. Additional history provided: Patient reports head pain for 6 days, nausea and severe right-sided neck stiffness. EXAM: CT HEAD WITHOUT CONTRAST TECHNIQUE: Contiguous axial images were obtained from the base of the skull through the vertex without intravenous contrast. COMPARISON:  No pertinent prior exams available for comparison. FINDINGS: Brain: Cerebral volume is normal for age. There is subtle ill-defined hyperdensity in the right occipital lobe region, which may reflect trace subarachnoid hemorrhage or parenchymal hemorrhage/hemorrhagic infarction (for instance as seen on  series 2, image 17) (series 4, image 14) (series 5, image 18). No evidence of an intracranial mass. No midline shift. Vascular: Asymmetric hyperdensity of the right transverse and sigmoid dural venous sinuses, suspicious for dural venous sinus thrombosis. Skull: Normal. Negative for fracture or focal lesion. Sinuses/Orbits: Visualized orbits show no acute finding. Trace mucosal thickening within the bilateral ethmoid air cells. These results were called by telephone at the time of interpretation on 07/18/2021 at 12:07 pm to provider Wynona Dove , who verbally acknowledged these results. IMPRESSION: There is asymmetric hyperdensity of the right transverse and sigmoid dural venous sinuses, highly suspicious for dural venous sinus thrombosis. Additionally, there is suggestion of subtle hyperdensity in the right occipital lobe region, which trace subarachnoid hemorrhage or parenchymal hemorrhage/hemorrhagic infarction. An MRI brain and MRV head without and with contrast are recommended for further evaluation. Electronically Signed   By: Kellie Simmering D.O.   On: 07/18/2021 12:10   MR BRAIN WO CONTRAST  Result Date: 07/18/2021 CLINICAL DATA:  Headache. Personal history of DVT. Dural venous sinus thrombosis suspected. EXAM: MRI HEAD WITHOUT CONTRAST MR VENOGRAM HEAD WITHOUT AND WITH CONTRAST TECHNIQUE: Multiplanar, multi-echo pulse sequences of the brain and surrounding structures were acquired without and with intravenous contrast. Angiographic images of the intracranial venous structures were acquired using MRV technique without and with intravenous contrast. CONTRAST:  9.30m GADAVIST GADOBUTROL 1 MMOL/ML IV SOLN COMPARISON:  CT head without contrast 07/18/2021 FINDINGS: MRI HEAD WITHOUT AND WITH CONTRAST Brain: Diffusion-weighted images demonstrate no acute infarct. No acute hemorrhage or mass lesion is present. No significant white matter lesions are present. The ventricles are of normal size. No significant  extraaxial fluid collection is present. The internal auditory canals are within normal limits. The brainstem and cerebellum are within normal limits Vascular: Marked susceptibility artifact is evident adjacent to the right transverse sinus. Flow is present in the major intracranial arteries. Skull and upper cervical spine: The craniocervical junction is normal. Upper cervical spine is within normal limits. Marrow signal is unremarkable. Sinuses/Orbits: Small amount of fluid is present in the mastoid air cells bilaterally. No obstructing nasopharyngeal lesion is present. The paranasal sinuses and mastoid air cells are otherwise clear. The globes and orbits are within normal limits. MR VENOGRAM HEAD WITHOUT AND WITH CONTRAST Pre and postcontrast images demonstrate extensive thrombosis in the right transverse sinus through the sigmoid sinus and into the upper right internal jugular sinus. No associated hemorrhage. The dural sinuses are otherwise patent. The left transverse sinus is patent. IMPRESSION: 1. Extensive thrombosis of the right transverse sinus through the sigmoid sinus and into the upper right internal jugular sinus. 2. No associated hemorrhage. 3. Normal MRI appearance of the brain. 4. Small amount of fluid in the mastoid air cells bilaterally. Electronically Signed   By: CSan MorelleM.D.   On: 07/18/2021 16:54   MR Venogram Head  Result Date: 07/18/2021 CLINICAL DATA:  Headache. Personal history of DVT.  Dural venous sinus thrombosis suspected. EXAM: MRI HEAD WITHOUT CONTRAST MR VENOGRAM HEAD WITHOUT AND WITH CONTRAST TECHNIQUE: Multiplanar, multi-echo pulse sequences of the brain and surrounding structures were acquired without and with intravenous contrast. Angiographic images of the intracranial venous structures were acquired using MRV technique without and with intravenous contrast. CONTRAST:  9.70m GADAVIST GADOBUTROL 1 MMOL/ML IV SOLN COMPARISON:  CT head without contrast 07/18/2021  FINDINGS: MRI HEAD WITHOUT AND WITH CONTRAST Brain: Diffusion-weighted images demonstrate no acute infarct. No acute hemorrhage or mass lesion is present. No significant white matter lesions are present. The ventricles are of normal size. No significant extraaxial fluid collection is present. The internal auditory canals are within normal limits. The brainstem and cerebellum are within normal limits Vascular: Marked susceptibility artifact is evident adjacent to the right transverse sinus. Flow is present in the major intracranial arteries. Skull and upper cervical spine: The craniocervical junction is normal. Upper cervical spine is within normal limits. Marrow signal is unremarkable. Sinuses/Orbits: Small amount of fluid is present in the mastoid air cells bilaterally. No obstructing nasopharyngeal lesion is present. The paranasal sinuses and mastoid air cells are otherwise clear. The globes and orbits are within normal limits. MR VENOGRAM HEAD WITHOUT AND WITH CONTRAST Pre and postcontrast images demonstrate extensive thrombosis in the right transverse sinus through the sigmoid sinus and into the upper right internal jugular sinus. No associated hemorrhage. The dural sinuses are otherwise patent. The left transverse sinus is patent. IMPRESSION: 1. Extensive thrombosis of the right transverse sinus through the sigmoid sinus and into the upper right internal jugular sinus. 2. No associated hemorrhage. 3. Normal MRI appearance of the brain. 4. Small amount of fluid in the mastoid air cells bilaterally. Electronically Signed   By: CSan MorelleM.D.   On: 07/18/2021 16:54      LFlora Lipps MD  [facility] 07/19/2021  If 7PM-7AM, please contact night-coverage

## 2021-07-19 NOTE — ED Notes (Signed)
Pt resting comfortably, family at bedside.

## 2021-07-19 NOTE — ED Notes (Signed)
Pt assisted to stand at bedside to use urinal and back in bed. Pt denies any complaints. No acute changes noted. Will continue to monitor.

## 2021-07-19 NOTE — Progress Notes (Addendum)
STROKE TEAM PROGRESS NOTE   INTERVAL HISTORY No family at bedside. Patient in bed, awake, alert, NAD. Patient continues on IV heparin with plan to transition to eliquis in the next two days.   Vitals:   07/19/21 0330 07/19/21 0430 07/19/21 0500 07/19/21 0645  BP: 104/65 (!) 101/59 112/76 112/86  Pulse: (!) 58 (!) 53 (!) 58 (!) 50  Resp: 16 15 18 14   Temp:      TempSrc:      SpO2: 99% 97% 100% 96%  Weight:      Height:       CBC:  Recent Labs  Lab 07/18/21 1028  WBC 11.9*  NEUTROABS 8.8*  HGB 16.0  HCT 46.0  MCV 83.2  PLT 255   Basic Metabolic Panel:  Recent Labs  Lab 07/18/21 1028  NA 139  K 3.9  CL 100  CO2 29  GLUCOSE 125*  BUN 14  CREATININE 0.88  CALCIUM 9.2   Lipid Panel:  Recent Labs  Lab 07/18/21 2325  CHOL 202*  TRIG 206*  HDL 46  CHOLHDL 4.4  VLDL 41*  LDLCALC 115*   HgbA1c:  Recent Labs  Lab 07/18/21 2325  HGBA1C 5.2    IMAGING past 24 hours CT Head Wo Contrast  Result Date: 07/18/2021 CLINICAL DATA:  Provided history: Severe headache. Additional history provided: Patient reports head pain for 6 days, nausea and severe right-sided neck stiffness. EXAM: CT HEAD WITHOUT CONTRAST TECHNIQUE: Contiguous axial images were obtained from the base of the skull through the vertex without intravenous contrast. COMPARISON:  No pertinent prior exams available for comparison. FINDINGS: Brain: Cerebral volume is normal for age. There is subtle ill-defined hyperdensity in the right occipital lobe region, which may reflect trace subarachnoid hemorrhage or parenchymal hemorrhage/hemorrhagic infarction (for instance as seen on series 2, image 17) (series 4, image 14) (series 5, image 18). No evidence of an intracranial mass. No midline shift. Vascular: Asymmetric hyperdensity of the right transverse and sigmoid dural venous sinuses, suspicious for dural venous sinus thrombosis. Skull: Normal. Negative for fracture or focal lesion. Sinuses/Orbits: Visualized orbits  show no acute finding. Trace mucosal thickening within the bilateral ethmoid air cells. These results were called by telephone at the time of interpretation on 07/18/2021 at 12:07 pm to provider 07/20/2021 , who verbally acknowledged these results. IMPRESSION: There is asymmetric hyperdensity of the right transverse and sigmoid dural venous sinuses, highly suspicious for dural venous sinus thrombosis. Additionally, there is suggestion of subtle hyperdensity in the right occipital lobe region, which trace subarachnoid hemorrhage or parenchymal hemorrhage/hemorrhagic infarction. An MRI brain and MRV head without and with contrast are recommended for further evaluation. Electronically Signed   By: Tanda Rockers D.O.   On: 07/18/2021 12:10   MR BRAIN WO CONTRAST  Result Date: 07/18/2021 CLINICAL DATA:  Headache. Personal history of DVT. Dural venous sinus thrombosis suspected. EXAM: MRI HEAD WITHOUT CONTRAST MR VENOGRAM HEAD WITHOUT AND WITH CONTRAST TECHNIQUE: Multiplanar, multi-echo pulse sequences of the brain and surrounding structures were acquired without and with intravenous contrast. Angiographic images of the intracranial venous structures were acquired using MRV technique without and with intravenous contrast. CONTRAST:  9.10mL GADAVIST GADOBUTROL 1 MMOL/ML IV SOLN COMPARISON:  CT head without contrast 07/18/2021 FINDINGS: MRI HEAD WITHOUT AND WITH CONTRAST Brain: Diffusion-weighted images demonstrate no acute infarct. No acute hemorrhage or mass lesion is present. No significant white matter lesions are present. The ventricles are of normal size. No significant extraaxial fluid collection is present. The  internal auditory canals are within normal limits. The brainstem and cerebellum are within normal limits Vascular: Marked susceptibility artifact is evident adjacent to the right transverse sinus. Flow is present in the major intracranial arteries. Skull and upper cervical spine: The craniocervical  junction is normal. Upper cervical spine is within normal limits. Marrow signal is unremarkable. Sinuses/Orbits: Small amount of fluid is present in the mastoid air cells bilaterally. No obstructing nasopharyngeal lesion is present. The paranasal sinuses and mastoid air cells are otherwise clear. The globes and orbits are within normal limits. MR VENOGRAM HEAD WITHOUT AND WITH CONTRAST Pre and postcontrast images demonstrate extensive thrombosis in the right transverse sinus through the sigmoid sinus and into the upper right internal jugular sinus. No associated hemorrhage. The dural sinuses are otherwise patent. The left transverse sinus is patent. IMPRESSION: 1. Extensive thrombosis of the right transverse sinus through the sigmoid sinus and into the upper right internal jugular sinus. 2. No associated hemorrhage. 3. Normal MRI appearance of the brain. 4. Small amount of fluid in the mastoid air cells bilaterally. Electronically Signed   By: Marin Roberts M.D.   On: 07/18/2021 16:54   MR Venogram Head  Result Date: 07/18/2021 CLINICAL DATA:  Headache. Personal history of DVT. Dural venous sinus thrombosis suspected. EXAM: MRI HEAD WITHOUT CONTRAST MR VENOGRAM HEAD WITHOUT AND WITH CONTRAST TECHNIQUE: Multiplanar, multi-echo pulse sequences of the brain and surrounding structures were acquired without and with intravenous contrast. Angiographic images of the intracranial venous structures were acquired using MRV technique without and with intravenous contrast. CONTRAST:  9.51mL GADAVIST GADOBUTROL 1 MMOL/ML IV SOLN COMPARISON:  CT head without contrast 07/18/2021 FINDINGS: MRI HEAD WITHOUT AND WITH CONTRAST Brain: Diffusion-weighted images demonstrate no acute infarct. No acute hemorrhage or mass lesion is present. No significant white matter lesions are present. The ventricles are of normal size. No significant extraaxial fluid collection is present. The internal auditory canals are within normal  limits. The brainstem and cerebellum are within normal limits Vascular: Marked susceptibility artifact is evident adjacent to the right transverse sinus. Flow is present in the major intracranial arteries. Skull and upper cervical spine: The craniocervical junction is normal. Upper cervical spine is within normal limits. Marrow signal is unremarkable. Sinuses/Orbits: Small amount of fluid is present in the mastoid air cells bilaterally. No obstructing nasopharyngeal lesion is present. The paranasal sinuses and mastoid air cells are otherwise clear. The globes and orbits are within normal limits. MR VENOGRAM HEAD WITHOUT AND WITH CONTRAST Pre and postcontrast images demonstrate extensive thrombosis in the right transverse sinus through the sigmoid sinus and into the upper right internal jugular sinus. No associated hemorrhage. The dural sinuses are otherwise patent. The left transverse sinus is patent. IMPRESSION: 1. Extensive thrombosis of the right transverse sinus through the sigmoid sinus and into the upper right internal jugular sinus. 2. No associated hemorrhage. 3. Normal MRI appearance of the brain. 4. Small amount of fluid in the mastoid air cells bilaterally. Electronically Signed   By: Marin Roberts M.D.   On: 07/18/2021 16:54    PHYSICAL EXAM Physical Exam  Constitutional: Appears well-developed and well-nourished.  Psych: Affect appropriate to situation Eyes: Normal external eye and conjunctiva. HENT: Normocephalic, no lesions, without obvious abnormality.   Musculoskeletal-no joint tenderness, deformity or swelling Cardiovascular: Normal rate and regular rhythm.  Respiratory: Effort normal, non-labored breathing saturations WNL GI: Soft.  No distension. There is no tenderness.  Skin: WDI   Neuro:  Mental Status: Alert, oriented, thought content appropriate.  Speech fluent without evidence of aphasia.  Able to follow  commands without difficulty. Cranial Nerves: II:  Visual  fields grossly normal,  III,IV, VI: ptosis not present, extra-ocular motions intact bilaterally pupils equal, round, reactive to light and accommodation V,VII: smile symmetric, facial light touch sensation normal bilaterally VIII: hearing normal bilaterally IX,X: uvula rises symmetrically XI: bilateral shoulder shrug XII: midline tongue extension Motor: Right : Upper extremity   5/5  Left:     Upper extremity   5/5  Lower extremity   5/5   Lower extremity   5/5 Tone and bulk:normal tone throughout; no atrophy noted Sensory: light touch intact throughout, bilaterally Cerebellar: normal finger-to-nose, normal rapid alternating movements and normal heel-to-shin test Gait: deferred     ASSESSMENT/PLAN [name] is a 45 y.o. male  PMHx of anxiety, asthma, DVT, transaminitis, and vertigo who presented to ED 3 hours ago with c/o HA x 5 days accompanied by n/v. He described the HA as constant, worsening, achy, sharp at times, and throbbing. He initially thought it was "sinus" and took Ibuprofen with mild improvement. He has not recently had a fever, chills, diarrhea, viral illness, cold, or COVID. Associated symptoms include neck pain.   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  MRI  normal, no infarct or ICH MRV Extensive thrombosis of the right transverse sinus through the sigmoid sinus and into the upper right internal jugular sinus. LDL 115 HgbA1c 5.2 Hypercoagulable work-up pending  VTE prophylaxis - Heparin IV No antithrombotic prior to admission, now on heparin IV. Will transition to eliquis tomorrow if neuro stable. Therapy recommendations:  pending Disposition:  pending, outpt follow up with hematology needed.  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 Pt was seen and examined.   Wife at bedside. Pt lying in bed, neuro intact, no acute distress. Overall HA now 1-2/10. But did stated one sharp pain at right frontal overnight lasting one second, otherwise, doing well. On heparin IV, tolerating well. Will switch to eliquis tomorrow if stable. Hypercoagulable labs still pending. LDL 115 and put on lipitor 20. He needs to follow up with hemotology as outpt after discharge. Will follow.   For detailed assessment and plan, please refer to above as I have made changes wherever appropriate.   Marvel Plan, MD PhD Stroke Neurology 07/19/2021 4:42 PM    To contact Stroke Continuity provider, please refer to [email or web address]. After hours, contact General Neurology

## 2021-07-19 NOTE — Progress Notes (Signed)
ANTICOAGULATION CONSULT NOTE  Pharmacy Consult for heparin Indication: dural venous sinus thrombus  Allergies  Allergen Reactions   Cats Claw (Uncaria Tomentosa) Shortness Of Breath    Allergy test revealed multiple environmental allergies - grass, pollen, etc -     Patient Measurements: Height: 5\' 10"  (177.8 cm) Weight: 92.1 kg (203 lb) IBW/kg (Calculated) : 73 Heparin Dosing Weight: 91 kg  Vital Signs: Temp: 98.5 F (36.9 C) (10/30 1357) Temp Source: Oral (10/30 1357) BP: 129/77 (10/30 2330) Pulse Rate: 63 (10/30 2330)  Labs: Recent Labs    07/18/21 1027 07/18/21 1028 07/18/21 2325  HGB  --  16.0  --   HCT  --  46.0  --   PLT  --  255  --   LABPROT 12.2  --   --   INR 0.9  --   --   HEPARINUNFRC  --   --  <0.10*  CREATININE  --  0.88  --      Estimated Creatinine Clearance: 123.4 mL/min (by C-G formula based on SCr of 0.88 mg/dL).   Assessment: 45 yo M with headache x 5 days. Found to have dural venous sinus thrombus - also found R trace SAH. Prior hx of DVT - but no AC PTA now. Heparin consult for thrombus. Given trace SAH - no bolus for heparin and lower goal.  Heparin level undetectable on gtt at 1000 units/hr. No issues with line or bleeding reported per RN.  Goal of Therapy:  Heparin level 0.3-0.5 units/ml Monitor platelets by anticoagulation protocol: Yes   Plan:  Increase heparin to 1200 units/hr Will f/u 6 hr heparin level  55, PharmD, BCPS Please see amion for complete clinical pharmacist phone list 07/19/2021 12:49 AM

## 2021-07-19 NOTE — Progress Notes (Signed)
ANTICOAGULATION CONSULT NOTE  Pharmacy Consult for heparin Indication: dural venous sinus thrombus  Allergies  Allergen Reactions   Cats Claw (Uncaria Tomentosa) Shortness Of Breath    Allergy test revealed multiple environmental allergies - grass, pollen, etc -     Patient Measurements: Height: 5\' 10"  (177.8 cm) Weight: 92.1 kg (203 lb) IBW/kg (Calculated) : 73 Heparin Dosing Weight: 91 kg  Vital Signs: BP: 121/81 (10/31 0830) Pulse Rate: 62 (10/31 0830)  Labs: Recent Labs    07/18/21 1027 07/18/21 1028 07/18/21 2325 07/19/21 0700  HGB  --  16.0  --   --   HCT  --  46.0  --   --   PLT  --  255  --   --   LABPROT 12.2  --   --   --   INR 0.9  --   --   --   HEPARINUNFRC  --   --  <0.10* <0.10*  CREATININE  --  0.88  --   --      Estimated Creatinine Clearance: 123.4 mL/min (by C-G formula based on SCr of 0.88 mg/dL).   Assessment: 45 yo M with headache x 5 days. Found to have dural venous sinus thrombus - also found R trace SAH. Prior hx of DVT - but no AC PTA now. Heparin consult for thrombus. Given trace SAH - no bolus for heparin and lower goal.  Heparin level undetectable s/p rate increase to 1200 units/hr, no infusion issues noted  Goal of Therapy:  Heparin level 0.3-0.5 units/ml Monitor platelets by anticoagulation protocol: Yes   Plan:  Increase heparin gtt to 1450 units/hr F/u 6 hour heparin level  55, PharmD Clinical Pharmacist ED Pharmacist Phone # [phone] 07/19/2021 8:38 AM

## 2021-07-19 NOTE — Progress Notes (Signed)
ANTICOAGULATION CONSULT NOTE - Follow Up Consult  Pharmacy Consult for heparin Indication:  dural venous sinus thrombus  Labs: Recent Labs    07/18/21 1027 07/18/21 1028 07/18/21 2325 07/19/21 0700 07/19/21 1452 07/19/21 2208  HGB  --  16.0  --   --  15.7  --   HCT  --  46.0  --   --  45.6  --   PLT  --  255  --   --  269  --   LABPROT 12.2  --   --   --   --   --   INR 0.9  --   --   --   --   --   HEPARINUNFRC  --   --    < > <0.10* 0.32 0.55  CREATININE  --  0.88  --   --   --   --    < > = values in this interval not displayed.    Assessment: 45yo male supratherapeutic on heparin after one level at goal; no infusion issues or signs of bleeding per RN.  Goal of Therapy:  Heparin level 0.3-0.5 units/ml   Plan:  Will decrease heparin infusion by 1 unit/kg/hr to 1400 units/hr and check level with am labs.    Vernard Gambles, PharmD, BCPS  07/19/2021,11:41 PM

## 2021-07-19 NOTE — ED Notes (Signed)
Went in to perform neurocheck on pt and pt stated he had an episode of chest pain that lasted for one second; pt states the pain was in the center of his chest and radiated to his back; EKG performed and given to Dr. Clayborne Dana; Dr. Julian Reil informed of pt's condition

## 2021-07-19 NOTE — Progress Notes (Signed)
ANTICOAGULATION CONSULT NOTE  Pharmacy Consult for heparin Indication: dural venous sinus thrombus  Allergies  Allergen Reactions   Cats Claw (Uncaria Tomentosa) Shortness Of Breath    Allergy test revealed multiple environmental allergies - grass, pollen, etc -     Patient Measurements: Height: 5\' 10"  (177.8 cm) Weight: 92.1 kg (203 lb) IBW/kg (Calculated) : 73 Heparin Dosing Weight: 91 kg  Vital Signs: BP: 118/83 (10/31 1500) Pulse Rate: 85 (10/31 1500)  Labs: Recent Labs    07/18/21 1027 07/18/21 1028 07/18/21 2325 07/19/21 0700 07/19/21 1452  HGB  --  16.0  --   --  15.7  HCT  --  46.0  --   --  45.6  PLT  --  255  --   --  269  LABPROT 12.2  --   --   --   --   INR 0.9  --   --   --   --   HEPARINUNFRC  --   --  <0.10* <0.10* 0.32  CREATININE  --  0.88  --   --   --      Estimated Creatinine Clearance: 123.4 mL/min (by C-G formula based on SCr of 0.88 mg/dL).   Assessment: 45 yo M with headache x 5 days. Found to have dural venous sinus thrombus - also found R trace SAH. Prior hx of DVT - but no AC PTA now. Heparin consult for thrombus. Given trace SAH - no bolus for heparin and lower goal.  Heparin level of 0.32 is therapeutic on heparin 1450 units/hr though at the lower end of goal range of 0.3 to 0.5 units/ml. No issues with IV infusion or access and no bleeding noted per RN.    Goal of Therapy:  Heparin level 0.3-0.5 units/ml Monitor platelets by anticoagulation protocol: Yes   Plan:  Increase heparin to 1500 units/hr to ensure remains in range  Check 6 hr heparin level   55, PharmD, BCPS Clinical Pharmacist 07/19/2021 3:44 PM

## 2021-07-20 LAB — LUPUS ANTICOAGULANT PANEL
DRVVT: 46.8 s (ref 0.0–47.0)
PTT Lupus Anticoagulant: 31.9 s (ref 0.0–51.9)

## 2021-07-20 LAB — CBC
HCT: 44.3 % (ref 39.0–52.0)
Hemoglobin: 15.7 g/dL (ref 13.0–17.0)
MCH: 29.9 pg (ref 26.0–34.0)
MCHC: 35.4 g/dL (ref 30.0–36.0)
MCV: 84.4 fL (ref 80.0–100.0)
Platelets: 224 10*3/uL (ref 150–400)
RBC: 5.25 MIL/uL (ref 4.22–5.81)
RDW: 13.5 % (ref 11.5–15.5)
WBC: 10 10*3/uL (ref 4.0–10.5)
nRBC: 0 % (ref 0.0–0.2)

## 2021-07-20 LAB — PROTEIN C ACTIVITY: Protein C Activity: 132 % (ref 73–180)

## 2021-07-20 LAB — PROTEIN S ACTIVITY: Protein S Activity: 60 % — ABNORMAL LOW (ref 63–140)

## 2021-07-20 LAB — ANTI-DNA ANTIBODY, DOUBLE-STRANDED: ds DNA Ab: 1 IU/mL (ref 0–9)

## 2021-07-20 LAB — HEPARIN LEVEL (UNFRACTIONATED)
Heparin Unfractionated: 0.54 IU/mL (ref 0.30–0.70)
Heparin Unfractionated: 0.55 IU/mL (ref 0.30–0.70)

## 2021-07-20 LAB — PROTEIN S, TOTAL: Protein S Ag, Total: 84 % (ref 60–150)

## 2021-07-20 LAB — ANTINUCLEAR ANTIBODIES, IFA: ANA Ab, IFA: NEGATIVE

## 2021-07-20 LAB — HOMOCYSTEINE: Homocysteine: 8.6 umol/L (ref 0.0–14.5)

## 2021-07-20 MED ORDER — APIXABAN 5 MG PO TABS
10.0000 mg | ORAL_TABLET | Freq: Two times a day (BID) | ORAL | Status: DC
  Administered 2021-07-20: 10 mg via ORAL
  Filled 2021-07-20: qty 2

## 2021-07-20 MED ORDER — ATORVASTATIN CALCIUM 40 MG PO TABS: 40.0000 mg | ORAL_TABLET | Freq: Every day | ORAL | Status: DC

## 2021-07-20 MED ORDER — APIXABAN 5 MG PO TABS: ORAL_TABLET | ORAL | 2 refills | Status: DC

## 2021-07-20 MED ORDER — APIXABAN (ELIQUIS) VTE STARTER PACK (10MG AND 5MG): ORAL_TABLET | ORAL | 0 refills | Status: DC

## 2021-07-20 MED ORDER — APIXABAN 5 MG PO TABS: 5.0000 mg | ORAL_TABLET | Freq: Two times a day (BID) | ORAL | Status: DC

## 2021-07-20 MED ORDER — ATORVASTATIN CALCIUM 40 MG PO TABS: 40.0000 mg | ORAL_TABLET | Freq: Every day | ORAL | 2 refills | Status: DC

## 2021-07-20 NOTE — Progress Notes (Signed)
ANTICOAGULATION CONSULT NOTE  Pharmacy Consult for Heparin and Apixaban Indication: dural venous sinus thrombus  Allergies  Allergen Reactions   Cats Claw (Uncaria Tomentosa) Shortness Of Breath    Allergy test revealed multiple environmental allergies - grass, pollen, etc -     Patient Measurements: Height: 5\' 10"  (177.8 cm) Weight: 92.1 kg (203 lb) IBW/kg (Calculated) : 73  Heparin Dosing Weight: 91 kg  Vital Signs: Temp: 98 F (36.7 C) (11/01 0816) Temp Source: Oral (11/01 0816) BP: 118/85 (11/01 0816) Pulse Rate: 65 (11/01 0816)  Labs: Recent Labs     0000 07/18/21 1027 07/18/21 1028 07/18/21 2325 07/19/21 1452 07/19/21 2208 07/20/21 0320 07/20/21 0930  HGB   < >  --  16.0  --  15.7  --  15.7  --   HCT  --   --  46.0  --  45.6  --  44.3  --   PLT  --   --  255  --  269  --  224  --   LABPROT  --  12.2  --   --   --   --   --   --   INR  --  0.9  --   --   --   --   --   --   HEPARINUNFRC  --   --   --    < > 0.32 0.55 0.54 0.55  CREATININE  --   --  0.88  --   --   --   --   --    < > = values in this interval not displayed.    Estimated Creatinine Clearance: 123.4 mL/min (by C-G formula based on SCr of 0.88 mg/dL).   Medications:  Scheduled:   atorvastatin  40 mg Oral Daily   sodium chloride flush  3 mL Intravenous Q12H   Infusions:   sodium chloride     heparin 1,400 Units/hr (07/20/21 0905)    Assessment: 44 yo M with headache x 5 days. Found to have dural venous sinus thrombus. Also found to have a R trace SAH. Prior history of a provoked DVT following a broken ankle 6 years ago. Patient was not on [facility] PTA. Heparin consult for thrombus. Due to trace SAH, no bolus for heparin and will target lower heparin goal range.   Per chart review, no issues with IV infusion or access and no bleeding noted per RN. Pharmacy asked to transition patient from heparin infusion to apixaban per Neurology recommendation.   Goal of Therapy:  Heparin level 0.3-0.5  units/ml Monitor platelets by anticoagulation protocol: Yes  Plan:  Stop heparin infusion at 1200 Start apixaban 10mg  BID x7 days, followed by 5mg  BID, first dose at 1200 Continue to monitor H&H and platelets    Thank you for allowing pharmacy to be a part of this patient's care.  [facility], PharmD Clinical Pharmacist

## 2021-07-20 NOTE — Progress Notes (Addendum)
STROKE TEAM PROGRESS NOTE   INTERVAL HISTORY His wife is at the bedside.  He is sitting on the edge of the bed in NAD. He is alert and oriented, following commands. He c/o head pressure when lying on his right side and slight neck discomfort. Spoke with both of them about the transition to Eliquis. He also had some concerns about Lipitor and explained why we have put him on this. We discussed how to transition back into an exercising by taking it slow and steady no heavy weight lifting or strenuous activity for a while. Talked to them about potential discharge today. All questions answered and patient and wife expressed clear understanding.   Vitals:   07/19/21 1730 07/19/21 2151 07/19/21 2200 07/20/21 0816  BP: 116/79 119/77  118/85  Pulse: 69 76 96 65  Resp: 16 18 16 14   Temp:  98.3 F (36.8 C)  98 F (36.7 C)  TempSrc:  Oral  Oral  SpO2: 95% 99% 99% 96%  Weight:      Height:       CBC:  Recent Labs  Lab 07/18/21 1028 07/19/21 1452 07/20/21 0320  WBC 11.9* 11.7* 10.0  NEUTROABS 8.8*  --   --   HGB 16.0 15.7 15.7  HCT 46.0 45.6 44.3  MCV 83.2 84.0 84.4  PLT 255 269 224   Basic Metabolic Panel:  Recent Labs  Lab 07/18/21 1028  NA 139  K 3.9  CL 100  CO2 29  GLUCOSE 125*  BUN 14  CREATININE 0.88  CALCIUM 9.2   Lipid Panel:  Recent Labs  Lab 07/18/21 2325  CHOL 202*  TRIG 206*  HDL 46  CHOLHDL 4.4  VLDL 41*  LDLCALC 115*   HgbA1c:  Recent Labs  Lab 07/18/21 2325  HGBA1C 5.2   Urine Drug Screen:  Recent Labs  Lab 07/19/21 1030  LABOPIA NONE DETECTED  COCAINSCRNUR NONE DETECTED  LABBENZ NONE DETECTED  AMPHETMU NONE DETECTED  THCU NONE DETECTED  LABBARB NONE DETECTED    Alcohol Level No results for input(s): ETH in the last 168 hours.  IMAGING past 24 hours No results found.  PHYSICAL EXAM  Temp:  [98 F (36.7 C)-98.3 F (36.8 C)] 98 F (36.7 C) (11/01 0816) Pulse Rate:  [61-116] 65 (11/01 0816) Resp:  [14-25] 14 (11/01 0816) BP:  (116-132)/(70-89) 118/85 (11/01 0816) SpO2:  [95 %-100 %] 96 % (11/01 0816)  General - Well nourished, well developed, in no apparent distress.  Ophthalmologic - fundi not visualized due to noncooperation.  Cardiovascular - Regular rhythm and rate.  Mental Status -  Level of arousal and orientation to time, place, and person were intact. Language including expression, naming, repetition, comprehension was assessed and found intact. Attention span and concentration were normal. Recent and remote memory were intact. Fund of Knowledge was assessed and was intact.  Cranial Nerves II - XII - II - Visual field intact OU. III, IV, VI - Extraocular movements intact. V - Facial sensation intact bilaterally. VII - Facial movement intact bilaterally. VIII - Hearing & vestibular intact bilaterally. X - Palate elevates symmetrically. XI - Chin turning & shoulder shrug intact bilaterally. XII - Tongue protrusion intact.  Motor Strength - The patient's strength was normal in all extremities and pronator drift was absent.  Bulk was normal and fasciculations were absent.   Motor Tone - Muscle tone was assessed at the neck and appendages and was normal.  Sensory - Light touch, temperature/pinprick were assessed and were symmetrical.  Coordination - The patient had normal movements in the hands and feet with no ataxia or dysmetria.  Tremor was absent.  Gait and Station - deferred.   ASSESSMENT/PLAN [name] is a 45 y.o. male with history of anxiety, asthma, DVT, transaminitis, and vertigo who presented to ED 3 hours ago with c/o HA x 5 days accompanied by n/v. He described the HA as constant, worsening, achy, sharp at times, and throbbing. He initially thought it was "sinus" and took Ibuprofen with mild improvement. He has not recently had a fever, chills, diarrhea, viral illness, cold, or COVID. Associated symptoms include neck pain.    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  MRI  normal, no infarct or ICH MRV Extensive thrombosis of the right transverse sinus through the sigmoid sinus and into the upper right internal jugular sinus. LDL 115 HgbA1c 5.2 Hypercoagulable work-up pending  VTE prophylaxis - Heparin IV No antithrombotic prior to admission, non heparin IV. transition to eliquis today. 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 Pt was seen and examined.   Pt sitting at the edge of bed, no complains. Wife at the bedside. Heparin IV changed to eliquis. OK to discharge from neuro standpoint. Questions answered to their satisfaction.   For detailed assessment and plan, please refer to above as I have made changes wherever appropriate.   Neurology will sign off. Please call with questions. Pt will follow up with stroke clinic NP at [facility] and also hematology in about 4 weeks. Thanks for the consult.   [facility], MD PhD Stroke Neurology 07/20/2021 4:04 PM    To contact Stroke Continuity provider, please refer to 13/09/2020. After hours, contact General Neurology

## 2021-07-20 NOTE — Progress Notes (Signed)
PT Cancellation Note  Patient Details Name: Shane Martin MRN: [id number] DOB: Feb 06, 1976   Cancelled Treatment:    Reason Eval/Treat Not Completed: PT screened, no needs identified, will sign off (pt independent; reporting improvement in HA, now 1/10).  Lillia Pauls, PT, DPT Acute Rehabilitation Services Pager [phone] Office [phone]    Norval Morton 07/20/2021, 1:46 PM

## 2021-07-20 NOTE — Care Management (Addendum)
ED RNCM received call from 2W charge nurse concerning a call from [facility] concerning Eliquis co-pay issue. RNCM reviewed prescription and contacted [facility] on [address].  The Pharmacist explained that prescription has to stat starter pack and indicate a 30 day in order for coupon to be redeemed. Contacted Dr. Rebekah Chesterfield  prescription updated.  Charge Nurse updated.

## 2021-07-20 NOTE — Progress Notes (Signed)
Discharge instructions (including medications) discussed with and copy provided to patient/caregiver 

## 2021-07-20 NOTE — TOC Progression Note (Signed)
Transition of Care [facility]) - Progression Note    Patient Details  Name: Shane Martin MRN: [id number] Date of Birth: [birth year]  [facility] CM/SW Contact  Beckie Busing, RN Phone Number:[phone]  07/20/2021, 1:48 PM  Clinical Narrative:    Benefits check for Eliquis initiated.         Expected Discharge Plan and Services           Expected Discharge Date: 07/20/21                                     Social Determinants of Health (SDOH) Interventions    Readmission Risk Interventions No flowsheet data found.

## 2021-07-20 NOTE — TOC Benefit Eligibility Note (Signed)
Transition of Care [facility]) Benefit Eligibility Note    Patient Details  Name: Shane Martin MRN: [id number] Date of Birth: 02-13-1976   Medication/Dose: Everlene Balls  2.5 MG BID : CO-PAY- $508.06   and  ELIQUIS  5 MG BID:  CO-PAY- $508.06  Covered?: Yes  Tier: 3 Drug  Prescription Coverage Preferred Pharmacy: [facility] with Person/Company/Phone Number:: PETTER  @ [employer]  RX # [phone]  Co-Pay: $508.06  Prior Approval: No  Deductible: Unmet (OUT-OF-POCKET:UNMET)  Additional Notes: ELIQUIS  10 MFG BID : Earle Gell Phone Number: 07/20/2021, 2:53 PM

## 2021-07-20 NOTE — Discharge Summary (Signed)
Physician Discharge Summary  Shane Martin BFX:[id number] DOB: 1975/10/21 DOA: 07/18/2021  PCP: Lujean Amel, MD  Admit date: 07/18/2021 Discharge date: 07/20/2021  Admitted From: Home  Discharge disposition: Home  Recommendations for Outpatient Follow-Up:   Follow up with your primary care provider in one week.  Check CBC, BMP, magnesium in the next visit Patient would benefit from a hematology follow-up as outpatient.  Hypercoagulable work-up has been sent from the hospital please follow-up as outpatient.  Patient might benefit from long-term anticoagulation  Discharge Diagnosis:   Principal Problem:   Dural venous sinus thrombosis Active Problems:   Asthma   History of DVT (deep vein thrombosis)   Discharge Condition: Improved.  Diet recommendation:   Regular.  Wound care: None.  Code status: Full.  History of Present Illness:   Shane Martin is a 45 y.o. male with medical history significant of asthma, DVT presented hospital with 5 to 6 days history of headache.  Patient did have a history of DVT 6 years ago in [place] and was seen by hematologist and was told that he did have some sort of genetic predisposition, but didn't require long term anticoagulation.  Treated with Eliquis for 6 months at that time.  On this admission, patient was hemodynamically stable.  CT head was suspicious for dural venous sinus thrombosis which was confirmed by MRI of the brain which showed extensive thrombosis of the right transverse sinus through sigmoid sinus and  upper right internal jugular vein.  Neurology was consulted, heparin drip was initiated and the patient was admitted hospital.  Hospital Course:   Following conditions were addressed during hospitalization as listed below,  Dural venous sinus thrombosis History of DVT in the past.  Possible etiologies include genetic predisposition, recent COVID vaccination.  Extensive dural venous sinus thrombosis noted on this admission.   Neurology was consulted and the patient was initiated on heparin drip which has been changed to Eliquis at this time. Neurology ordered hypercoagulable work-up.    HIV was nonreactive.  Lipid panel showed elevated cholesterol and triglyceride with LDL cholesterol of 115.  Hemoglobin A1c was 5.2.  TSH was 3.8.  Patient will benefit from follow-up with Hematology as outpatient.  ESR within normal limits.     Leukocytosis Likely reactive.  No signs of infection.  Follow CBC.     History of DVT  Thought to be secondary to injury in the past, provoked..  Currently hypercoagulable work-up underway.  Due to extensive thrombus intracranially patient might benefit from long-term anticoagulation.  This will need to be decided as outpatient.  Disposition.  At this time, patient is stable for disposition home with outpatient PCP and hematology follow-up  Medical Consultants:   Neurology  Procedures:    None Subjective:   Today, patient was seen and examined at bedside.  Patient has mild neck discomfort but otherwise feels okay.  Discharge Exam:   Vitals:   07/19/21 2200 07/20/21 0816  BP:  118/85  Pulse: 96 65  Resp: 16 14  Temp:  98 F (36.7 C)  SpO2: 99% 96%   Vitals:   07/19/21 1730 07/19/21 2151 07/19/21 2200 07/20/21 0816  BP: 116/79 119/77  118/85  Pulse: 69 76 96 65  Resp: '16 18 16 14  ' Temp:  98.3 F (36.8 C)  98 F (36.7 C)  TempSrc:  Oral  Oral  SpO2: 95% 99% 99% 96%  Weight:      Height:       General: Alert awake, not  in obvious distress HENT: pupils equally reacting to light,  No scleral pallor or icterus noted. Oral mucosa is moist.  Chest:  Clear breath sounds.  Diminished breath sounds bilaterally. No crackles or wheezes.  CVS: S1 &S2 heard. No murmur.  Regular rate and rhythm. Abdomen: Soft, nontender, nondistended.  Bowel sounds are heard.   Extremities: No cyanosis, clubbing or edema.  Peripheral pulses are palpable. Psych: Alert, awake and oriented, normal  mood CNS:  No cranial nerve deficits.  Power equal in all extremities.   Skin: Warm and dry.  No rashes noted.  The results of significant diagnostics from this hospitalization (including imaging, microbiology, ancillary and laboratory) are listed below for reference.     Diagnostic Studies:   CT Head Wo Contrast  Result Date: 07/18/2021 CLINICAL DATA:  Provided history: Severe headache. Additional history provided: Patient reports head pain for 6 days, nausea and severe right-sided neck stiffness. EXAM: CT HEAD WITHOUT CONTRAST TECHNIQUE: Contiguous axial images were obtained from the base of the skull through the vertex without intravenous contrast. COMPARISON:  No pertinent prior exams available for comparison. FINDINGS: Brain: Cerebral volume is normal for age. There is subtle ill-defined hyperdensity in the right occipital lobe region, which may reflect trace subarachnoid hemorrhage or parenchymal hemorrhage/hemorrhagic infarction (for instance as seen on series 2, image 17) (series 4, image 14) (series 5, image 18). No evidence of an intracranial mass. No midline shift. Vascular: Asymmetric hyperdensity of the right transverse and sigmoid dural venous sinuses, suspicious for dural venous sinus thrombosis. Skull: Normal. Negative for fracture or focal lesion. Sinuses/Orbits: Visualized orbits show no acute finding. Trace mucosal thickening within the bilateral ethmoid air cells. These results were called by telephone at the time of interpretation on 07/18/2021 at 12:07 pm to provider Wynona Dove , who verbally acknowledged these results. IMPRESSION: There is asymmetric hyperdensity of the right transverse and sigmoid dural venous sinuses, highly suspicious for dural venous sinus thrombosis. Additionally, there is suggestion of subtle hyperdensity in the right occipital lobe region, which trace subarachnoid hemorrhage or parenchymal hemorrhage/hemorrhagic infarction. An MRI brain and MRV head without  and with contrast are recommended for further evaluation. Electronically Signed   By: Kellie Simmering D.O.   On: 07/18/2021 12:10   MR BRAIN WO CONTRAST  Result Date: 07/18/2021 CLINICAL DATA:  Headache. Personal history of DVT. Dural venous sinus thrombosis suspected. EXAM: MRI HEAD WITHOUT CONTRAST MR VENOGRAM HEAD WITHOUT AND WITH CONTRAST TECHNIQUE: Multiplanar, multi-echo pulse sequences of the brain and surrounding structures were acquired without and with intravenous contrast. Angiographic images of the intracranial venous structures were acquired using MRV technique without and with intravenous contrast. CONTRAST:  9.64m GADAVIST GADOBUTROL 1 MMOL/ML IV SOLN COMPARISON:  CT head without contrast 07/18/2021 FINDINGS: MRI HEAD WITHOUT AND WITH CONTRAST Brain: Diffusion-weighted images demonstrate no acute infarct. No acute hemorrhage or mass lesion is present. No significant white matter lesions are present. The ventricles are of normal size. No significant extraaxial fluid collection is present. The internal auditory canals are within normal limits. The brainstem and cerebellum are within normal limits Vascular: Marked susceptibility artifact is evident adjacent to the right transverse sinus. Flow is present in the major intracranial arteries. Skull and upper cervical spine: The craniocervical junction is normal. Upper cervical spine is within normal limits. Marrow signal is unremarkable. Sinuses/Orbits: Small amount of fluid is present in the mastoid air cells bilaterally. No obstructing nasopharyngeal lesion is present. The paranasal sinuses and mastoid air cells are otherwise clear. The  globes and orbits are within normal limits. MR VENOGRAM HEAD WITHOUT AND WITH CONTRAST Pre and postcontrast images demonstrate extensive thrombosis in the right transverse sinus through the sigmoid sinus and into the upper right internal jugular sinus. No associated hemorrhage. The dural sinuses are otherwise patent. The  left transverse sinus is patent. IMPRESSION: 1. Extensive thrombosis of the right transverse sinus through the sigmoid sinus and into the upper right internal jugular sinus. 2. No associated hemorrhage. 3. Normal MRI appearance of the brain. 4. Small amount of fluid in the mastoid air cells bilaterally. Electronically Signed   By: San Morelle M.D.   On: 07/18/2021 16:54   MR Venogram Head  Result Date: 07/18/2021 CLINICAL DATA:  Headache. Personal history of DVT. Dural venous sinus thrombosis suspected. EXAM: MRI HEAD WITHOUT CONTRAST MR VENOGRAM HEAD WITHOUT AND WITH CONTRAST TECHNIQUE: Multiplanar, multi-echo pulse sequences of the brain and surrounding structures were acquired without and with intravenous contrast. Angiographic images of the intracranial venous structures were acquired using MRV technique without and with intravenous contrast. CONTRAST:  9.59m GADAVIST GADOBUTROL 1 MMOL/ML IV SOLN COMPARISON:  CT head without contrast 07/18/2021 FINDINGS: MRI HEAD WITHOUT AND WITH CONTRAST Brain: Diffusion-weighted images demonstrate no acute infarct. No acute hemorrhage or mass lesion is present. No significant white matter lesions are present. The ventricles are of normal size. No significant extraaxial fluid collection is present. The internal auditory canals are within normal limits. The brainstem and cerebellum are within normal limits Vascular: Marked susceptibility artifact is evident adjacent to the right transverse sinus. Flow is present in the major intracranial arteries. Skull and upper cervical spine: The craniocervical junction is normal. Upper cervical spine is within normal limits. Marrow signal is unremarkable. Sinuses/Orbits: Small amount of fluid is present in the mastoid air cells bilaterally. No obstructing nasopharyngeal lesion is present. The paranasal sinuses and mastoid air cells are otherwise clear. The globes and orbits are within normal limits. MR VENOGRAM HEAD WITHOUT AND  WITH CONTRAST Pre and postcontrast images demonstrate extensive thrombosis in the right transverse sinus through the sigmoid sinus and into the upper right internal jugular sinus. No associated hemorrhage. The dural sinuses are otherwise patent. The left transverse sinus is patent. IMPRESSION: 1. Extensive thrombosis of the right transverse sinus through the sigmoid sinus and into the upper right internal jugular sinus. 2. No associated hemorrhage. 3. Normal MRI appearance of the brain. 4. Small amount of fluid in the mastoid air cells bilaterally. Electronically Signed   By: CSan MorelleM.D.   On: 07/18/2021 16:54     Labs:   Basic Metabolic Panel: Recent Labs  Lab 07/18/21 1028  NA 139  K 3.9  CL 100  CO2 29  GLUCOSE 125*  BUN 14  CREATININE 0.88  CALCIUM 9.2   GFR Estimated Creatinine Clearance: 123.4 mL/min (by C-G formula based on SCr of 0.88 mg/dL). Liver Function Tests: Recent Labs  Lab 07/18/21 1028  AST 27  ALT 45*  ALKPHOS 62  BILITOT 0.8  PROT 8.0  ALBUMIN 4.4   No results for input(s): LIPASE, AMYLASE in the last 168 hours. No results for input(s): AMMONIA in the last 168 hours. Coagulation profile Recent Labs  Lab 07/18/21 1027  INR 0.9    CBC: Recent Labs  Lab 07/18/21 1028 07/19/21 1452 07/20/21 0320  WBC 11.9* 11.7* 10.0  NEUTROABS 8.8*  --   --   HGB 16.0 15.7 15.7  HCT 46.0 45.6 44.3  MCV 83.2 84.0 84.4  PLT 255  269 224   Cardiac Enzymes: No results for input(s): CKTOTAL, CKMB, CKMBINDEX, TROPONINI in the last 168 hours. BNP: Invalid input(s): POCBNP CBG: No results for input(s): GLUCAP in the last 168 hours. D-Dimer No results for input(s): DDIMER in the last 72 hours. Hgb A1c Recent Labs    07/18/21 2325  HGBA1C 5.2   Lipid Profile Recent Labs    07/18/21 2325  CHOL 202*  HDL 46  LDLCALC 115*  TRIG 206*  CHOLHDL 4.4   Thyroid function studies Recent Labs    07/18/21 2325  TSH 3.864   Anemia work  up Recent Labs    07/18/21 2325  VITAMINB12 462   Microbiology Recent Results (from the past 240 hour(s))  Resp Panel by RT-PCR (Flu A&B, Covid) Nasopharyngeal Swab     Status: None   Collection Time: 07/18/21 12:21 PM   Specimen: Nasopharyngeal Swab; Nasopharyngeal(NP) swabs in vial transport medium  Result Value Ref Range Status   SARS Coronavirus 2 by RT PCR NEGATIVE NEGATIVE Final    Comment: (NOTE) SARS-CoV-2 target nucleic acids are NOT DETECTED.  The SARS-CoV-2 RNA is generally detectable in upper respiratory specimens during the acute phase of infection. The lowest concentration of SARS-CoV-2 viral copies this assay can detect is 138 copies/mL. A negative result does not preclude SARS-Cov-2 infection and should not be used as the sole basis for treatment or other patient management decisions. A negative result may occur with  improper specimen collection/handling, submission of specimen other than nasopharyngeal swab, presence of viral mutation(s) within the areas targeted by this assay, and inadequate number of viral copies(<138 copies/mL). A negative result must be combined with clinical observations, patient history, and epidemiological information. The expected result is Negative.  Fact Sheet for Patients:  EntrepreneurPulse.com.au  Fact Sheet for Healthcare Providers:  IncredibleEmployment.be  This test is no t yet approved or cleared by the [place] FDA and  has been authorized for detection and/or diagnosis of SARS-CoV-2 by FDA under an Emergency Use Authorization (EUA). This EUA will remain  in effect (meaning this test can be used) for the duration of the COVID-19 declaration under Section 564(b)(1) of the Act, 21 U.S.C.section 360bbb-3(b)(1), unless the authorization is terminated  or revoked sooner.       Influenza A by PCR NEGATIVE NEGATIVE Final   Influenza B by PCR NEGATIVE NEGATIVE Final    Comment:  (NOTE) The Xpert Xpress SARS-CoV-2/FLU/RSV plus assay is intended as an aid in the diagnosis of influenza from Nasopharyngeal swab specimens and should not be used as a sole basis for treatment. Nasal washings and aspirates are unacceptable for Xpert Xpress SARS-CoV-2/FLU/RSV testing.  Fact Sheet for Patients: EntrepreneurPulse.com.au  Fact Sheet for Healthcare Providers: IncredibleEmployment.be  This test is not yet approved or cleared by the [place] FDA and has been authorized for detection and/or diagnosis of SARS-CoV-2 by FDA under an Emergency Use Authorization (EUA). This EUA will remain in effect (meaning this test can be used) for the duration of the COVID-19 declaration under Section 564(b)(1) of the Act, 21 U.S.C. section 360bbb-3(b)(1), unless the authorization is terminated or revoked.  Performed at [facility], [address] [place], [place] [address]      Discharge Instructions:   Discharge Instructions     Diet general   Complete by: As directed    Discharge instructions   Complete by: As directed    Follow up with your primary care provider in one week. Continue to take blood thinner (eliquis)  as prescribed. Take precautions while on eliquis. Seek medical attention for worsening symptoms including visual changes, focal body weakness, increasing headache. Follow up with hematology oncology in 2-3 months.  Try to avoid over-the-counter NSAIDs like Aleve Motrin naproxen.   Increase activity slowly   Complete by: As directed       Allergies as of 07/20/2021       Reactions   Cats Claw (uncaria Tomentosa) Shortness Of Breath   Allergy test revealed multiple environmental allergies - grass, pollen, etc -         Medication List     STOP taking these medications    ibuprofen 200 MG tablet Commonly known as: ADVIL   pseudoephedrine-acetaminophen 30-500 MG Tabs tablet Commonly known as:  TYLENOL SINUS       TAKE these medications    acetaminophen 500 MG tablet Commonly known as: TYLENOL Take 500-1,000 mg by mouth every 6 (six) hours as needed for headache.   albuterol 108 (90 Base) MCG/ACT inhaler Commonly known as: VENTOLIN HFA Inhale 2 puffs into the lungs every 6 (six) hours as needed (excercise induced asthma).   Apixaban Starter Pack (67m and 561m Commonly known as: ELIQUIS STARTER PACK Take as directed on package: start with two-91m32mablets twice daily for 7 days. On day 8, switch to one-91mg76mblet twice daily.   atorvastatin 40 MG tablet Commonly known as: LIPITOR Take 1 tablet (40 mg total) by mouth daily.   Emergen-C Vitamin C Pack Take 1 packet by mouth 3 (three) times a week.   multivitamin with minerals Tabs tablet Take 1 tablet by mouth daily. Multiple vitamin for fertility        Follow-up Information     Koirala, Dibas, MD Follow up.   Specialty: Family Medicine Contact information: [phone]         CrenLelon Perla .   Specialty: Cardiology Contact information: [id number] [phone]              Time coordinating discharge: 39 minutes  Signed:  Mikaiya Tramble  [facility] 07/20/2021, 11:44 AM

## 2021-07-20 NOTE — TOC Progression Note (Addendum)
Transition of Care [facility] - Progression Note    Patient Details  Name: Shane Martin MRN: [id number] Date of Birth: 08/27/1976  Transition of Care [facility]) CM/SW [place], RN Phone [phone]  07/20/2021, 3:59 PM  Clinical Narrative:    CM at bedside to speak to patient in reference to the cost of copay. Benefits check cost is $508. Patient states that he can not pay this copay and that the pharmacy may be out of network with his insurance which would drive the price up. CM called [facility] to inquire about the patients copay. Per pharmacy the patient would be expected to pay $626 for eliquis starter kit. CM has provided patient with 30 day supply and co pay assistance card.MD notified that order must be for 30 day supply in order for patient to be able to use copay discount card.  MD has submitted new 30 day supply script to [facility] on [address]. CM has provided patient with $10 copay card which [facility] says patient can use. Patient has been update. No other needs noted. TOC will sign off.         Expected Discharge Plan and Services           Expected Discharge Date: 07/20/21                                     Social Determinants of Health (SDOH) Interventions    Readmission Risk Interventions No flowsheet data found.

## 2021-07-20 NOTE — Progress Notes (Signed)
Pt left with CNA in wheelchair. Pt had all belongings and paper work. Pt stated that wife picked up his eliquis and is able to give himself his night time dose. Pts Wife will meet pt and staff at  front entrance. Pt didn't have any questions and was very thankful for his care.

## 2021-07-20 NOTE — Discharge Instructions (Signed)
Information on my medicine - ELIQUIS (apixaban)  This medication education was reviewed with me or my healthcare representative as part of my discharge preparation.  Why was Eliquis prescribed for you? Eliquis was prescribed to treat blood clots that may have been found in the veins of your legs (deep vein thrombosis) or in your lungs (pulmonary embolism) and to reduce the risk of them occurring again.  What do You need to know about Eliquis ? The starting dose is 10 mg (two 5 mg tablets) taken TWICE daily for the FIRST SEVEN (7) DAYS, then on 07/27/2021  the dose is reduced to ONE 5 mg tablet taken TWICE daily.  Eliquis may be taken with or without food.   Try to take the dose about the same time in the morning and in the evening. If you have difficulty swallowing the tablet whole please discuss with your pharmacist how to take the medication safely.  Take Eliquis exactly as prescribed and DO NOT stop taking Eliquis without talking to the doctor who prescribed the medication.  Stopping may increase your risk of developing a new blood clot.  Refill your prescription before you run out.  After discharge, you should have regular check-up appointments with your healthcare provider that is prescribing your Eliquis.    What do you do if you miss a dose? If a dose of ELIQUIS is not taken at the scheduled time, take it as soon as possible on the same day and twice-daily administration should be resumed. The dose should not be doubled to make up for a missed dose.  Important Safety Information A possible side effect of Eliquis is bleeding. You should call your healthcare provider right away if you experience any of the following: Bleeding from an injury or your nose that does not stop. Unusual colored urine (red or dark brown) or unusual colored stools (red or black). Unusual bruising for unknown reasons. A serious fall or if you hit your head (even if there is no bleeding).  Some  medicines may interact with Eliquis and might increase your risk of bleeding or clotting while on Eliquis. To help avoid this, consult your healthcare provider or pharmacist prior to using any new prescription or non-prescription medications, including herbals, vitamins, non-steroidal anti-inflammatory drugs (NSAIDs) and supplements.  This website has more information on Eliquis (apixaban): http://www.eliquis.com/eliquis/home

## 2021-07-21 LAB — PROTEIN C, TOTAL: Protein C, Total: 100 % (ref 60–150)

## 2021-07-22 ENCOUNTER — Telehealth: Payer: Self-pay | Admitting: Hematology and Oncology

## 2021-07-22 LAB — CARDIOLIPIN ANTIBODIES, IGG, IGM, IGA
Anticardiolipin IgA: <9 APL U/mL (ref 0–11)
Anticardiolipin IgG: <9 GPL U/mL (ref 0–14)
Anticardiolipin IgM: <9 MPL U/mL (ref 0–12)

## 2021-07-22 LAB — FACTOR 5 LEIDEN

## 2021-07-22 LAB — BETA-2-GLYCOPROTEIN I ABS, IGG/M/A
Beta-2 Glyco I IgG: <9 GPI IgG units (ref 0–20)
Beta-2-Glycoprotein I IgA: <9 GPI IgA units (ref 0–25)
Beta-2-Glycoprotein I IgM: <9 GPI IgM units (ref 0–32)

## 2021-07-22 NOTE — Telephone Encounter (Signed)
Scheduled appt per 11/1 referral. Pt is aware of appt date and time.  

## 2021-07-23 ENCOUNTER — Other Ambulatory Visit (HOSPITAL_BASED_OUTPATIENT_CLINIC_OR_DEPARTMENT_OTHER): Payer: Self-pay

## 2021-07-23 LAB — PROTHROMBIN GENE MUTATION

## 2021-07-23 MED ORDER — MODERNA COVID-19 BIVAL BOOSTER 50 MCG/0.5ML IM SUSP
INTRAMUSCULAR | 0 refills | Status: DC
  Filled 2021-07-23: qty 0.5, 1d supply, fill #0

## 2021-07-25 LAB — PHOSPHATIDYLSERINE ANTIBODIES
Phosphatydalserine, IgA: 1 APS Units (ref 0–19)
Phosphatydalserine, IgG: 9 Units (ref 0–30)
Phosphatydalserine, IgM: <10 Units (ref 0–30)

## 2021-07-27 LAB — MTHFR DNA ANALYSIS

## 2021-07-28 DIAGNOSIS — R079 Chest pain, unspecified: Secondary | ICD-10-CM | POA: Diagnosis not present

## 2021-07-28 DIAGNOSIS — Z86718 Personal history of other venous thrombosis and embolism: Secondary | ICD-10-CM | POA: Diagnosis not present

## 2021-07-28 DIAGNOSIS — G08 Intracranial and intraspinal phlebitis and thrombophlebitis: Secondary | ICD-10-CM | POA: Diagnosis not present

## 2021-07-28 DIAGNOSIS — Z79899 Other long term (current) drug therapy: Secondary | ICD-10-CM | POA: Diagnosis not present

## 2021-07-29 ENCOUNTER — Telehealth: Payer: Self-pay | Admitting: Cardiovascular Disease

## 2021-07-29 NOTE — Telephone Encounter (Signed)
Called patient back. He is to be seen on Monday- 11/14 by Dr.O'Neal. He is a previous patient of Dr.Crenshaw last seen in 2019.  Patient was recently seen in hospital- started on Eliquis. He states that for the last 2 days or so he has noticed when he bends over and takes a deep breath he has cramp pain in his left side.  He states once he notices the pain he can stand up and it gets better. He has no other pain or symptoms- does mention having nausea this morning but he had coffee on a empty stomach and a stressful work meeting. Patient does say he is stressed, and just wanted to make sure nothing serious is going on. I advised with patient if he can replicate the pain, and it only occurs when doing certain activity it does not sound cardiac- however I did suggest to keep the upcoming appointment on Monday we will do an EKG and advised if he started having any other symptoms to call us and let us know.   Patient verbalized understanding, thankful for the call back.   Thanks!

## 2021-07-29 NOTE — Telephone Encounter (Signed)
Pt c/o of Chest Pain: STAT if CP now or developed within 24 hours  1. Are you having CP right now? Only if bending over to the left side and breathing in   2. Are you experiencing any other symptoms (ex. SOB, nausea, vomiting, sweating)? Warming sensation in the left area under left breast, nausea    3. How long have you been experiencing CP? Last couple of days   4. Is your CP continuous or coming and going? Only in the moment that he is bending over on left side and breathing in and out    5. Have you taken Nitroglycerin? no ?

## 2021-08-01 NOTE — Progress Notes (Signed)
Cardiology Office Note:   Date:  08/02/2021  NAME:  Shane Martin    MRN: [id number] DOB:  [birth year]   PCP:  Shane Bussing, MD  Cardiologist:  Shane Millers, MD  Electrophysiologist:  None   Referring MD: Shane Bussing, MD   Chief Complaint  Patient presents with   Chest Pain   History of Present Illness:   Shane Martin is a 45 y.o. male with a hx of DVT, dural venous sinus thrombosis who is being seen today for the evaluation of chest pain at the request of Shane Martin, [place], MD. he reports he was recently hospitalized for a dural venous sinus thrombosis.  Apparently during that hospitalization he developed some cramping in his chest.  He reports a cramping sensation in the left chest when he leans forward and with inspiration.  He reports over the past 5 days it is gotten better.  Symptoms are less frequent.  They can occur daily.  He cannot reproduce them as often as he once could.  He does report waking up in the middle night with chest tightness.  He reports anxiety.  He reports that given everything he has gone through recently he is little anxious.  No concerns for sleep apnea.  He does suffer with anxiety and has had panic attacks in the past.  His chest tightness is nonexertional.  It is not worsened by exertion or alleviated by rest.  Symptoms can last seconds to minutes.  His EKG in office is normal.  He has never had a heart attack or stroke.  He is not diabetic.  Lipitor was recommended by neurology for his dural venous sinus thrombosis.  He has not started this yet.  I am unaware of an indication for this but if neurology feels strongly I would recommend he comply with the recommendation.  He does have a family history of heart disease.  Father started having heart troubles at age 61.  He does not smoke, drink alcohol or use drugs.  He presents with his wife.  They are expecting a child soon.  No regular exercise.  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  No  association of food with his chest pain.  His chest pain is not reproducible with palpation.  Problem List DVT Dural venous sinus thrombosis -06/2021  Past Medical History: Past Medical History:  Diagnosis Date   Anxiety    Asthma    DVT (deep venous thrombosis) (HCC)    Elevated LFTs    Vertigo     Past Surgical History: Past Surgical History:  Procedure Laterality Date   No prior surgery      Current Medications: Current Meds  Medication Sig   albuterol (VENTOLIN HFA) 108 (90 Base) MCG/ACT inhaler Inhale 2 puffs into the lungs every 6 (six) hours as needed (excercise induced asthma).   apixaban (ELIQUIS) 5 MG TABS tablet Take 5 mg by mouth 2 (two) times daily.   atorvastatin (LIPITOR) 40 MG tablet Take 1 tablet (40 mg total) by mouth daily.   COVID-19 mRNA bivalent vaccine, Moderna, (MODERNA COVID-19 BIVAL BOOSTER) 50 MCG/0.5ML injection Inject into the muscle.   [DISCONTINUED] APIXABAN (ELIQUIS) VTE STARTER PACK (10MG  AND 5MG ) Take as directed on package: start with two-5mg  tablets twice daily for 7 days. On day 8, switch to one-5mg  tablet twice daily.     Allergies:    Cats claw (uncaria tomentosa)   Social History: Social History   Socioeconomic History   Marital status: Married  Spouse name: Not on file   Number of children: Not on file   Years of education: Not on file   Highest education level: Not on file  Occupational History   Occupation: Field seismologist    Comment: [employer]  Tobacco Use   Smoking status: Never    Passive exposure: Never   Smokeless tobacco: Never  Vaping Use   Vaping Use: Never used  Substance and Sexual Activity   Alcohol use: No   Drug use: No   Sexual activity: Not on file  Other Topics Concern   Not on file  Social History Narrative   Not on file   Social Determinants of Health   Financial Resource Strain: Not on file  Food Insecurity: Not on file  Transportation Needs: Not on file  Physical Activity: Not on file   Stress: Not on file  Social Connections: Not on file     Family History: The patient's family history includes CAD (age of onset: 37) in his father.  ROS:   All other ROS reviewed and negative. Pertinent positives noted in the HPI.     EKGs/Labs/Other Studies Reviewed:   The following studies were personally reviewed by me today:  EKG:  EKG is ordered today.  The ekg ordered today demonstrates NSR 68 bpm, no NSTT, and was personally reviewed by me.   TTE 10/17/2017  - Left ventricle: Wall thickness was increased in a pattern of mild    LVH. Systolic function was normal. The estimated ejection    fraction was in the range of 60% to 65%. Left ventricular    diastolic function parameters were normal.  - Atrial septum: No defect or patent foramen ovale was identified.   Recent Labs: 07/18/2021: ALT 45; BUN 14; Creatinine, Ser 0.88; Potassium 3.9; Sodium 139; TSH 3.864 07/20/2021: Hemoglobin 15.7; Platelets 224   Recent Lipid Panel    Component Value Date/Time   CHOL 202 (H) 07/18/2021 2325   TRIG 206 (H) 07/18/2021 2325   HDL 46 07/18/2021 2325   CHOLHDL 4.4 07/18/2021 2325   VLDL 41 (H) 07/18/2021 2325   LDLCALC 115 (H) 07/18/2021 2325    Physical Exam:   VS:  BP 126/70 (BP Location: Left Arm, Patient Position: Sitting, Cuff Size: Normal)   Pulse 64   Resp 20   Ht 5\' 10"  (1.778 m)   Wt 199 lb (90.3 kg)   SpO2 99%   BMI 28.55 kg/m    Wt Readings from Last 3 Encounters:  08/02/21 199 lb (90.3 kg)  07/18/21 203 lb (92.1 kg)  04/03/18 204 lb 4 oz (92.6 kg)    General: Well nourished, well developed, in no acute distress Head: Atraumatic, normal size  Eyes: PEERLA, EOMI  Neck: Supple, no JVD Endocrine: No thryomegaly Cardiac: Normal S1, S2; RRR; no murmurs, rubs, or gallops Lungs: Clear to auscultation bilaterally, no wheezing, rhonchi or rales  Abd: Soft, nontender, no hepatomegaly  Ext: No edema, pulses 2+ Musculoskeletal: No deformities, BUE and BLE strength  normal and equal Skin: Warm and dry, no rashes   Neuro: Alert and oriented to person, place, time, and situation, CNII-XII grossly intact, no focal deficits  Psych: Normal mood and affect   ASSESSMENT:   Shane Martin is a 45 y.o. male who presents for the following: 1. Precordial pain     PLAN:   1. Precordial pain -Noncardiac chest pain.  Presents with cramping in his chest that is worse with bending over and position.  It  is also associated with anxiety and stress.  He was recently hospitalized for dural venous sinus thrombosis.  I suspect he has more stress than he is leading on.  His EKG in office is normal with no acute ischemic changes or evidence of prior infarction.  His cardiovascular examination is normal.  There is no association with food.  Is not reproducible on palpation.  I highly suspect this is just stress related.  I recommended ibuprofen and Tylenol as needed.  We did discuss coronary CTA given his family history of heart disease.  I think this will further reassure him that this is not his heart.  We also likely guide need for aspirin and statin therapy.  He reports he would like to hold on this.  His symptoms are improving.  I think this is reasonable as this is likely noncardiac chest pain.  He will see [place] as needed.  Disposition: Return if symptoms worsen or fail to improve.  Medication Adjustments/Labs and Tests Ordered: Current medicines are reviewed at length with the patient today.  Concerns regarding medicines are outlined above.  Orders Placed This Encounter  Procedures   EKG 12-Lead   No orders of the defined types were placed in this encounter.   Patient Instructions  Medication Instructions:  The current medical regimen is effective;  continue present plan and medications.  *If you need a refill on your cardiac medications before your next appointment, please call your pharmacy*  Follow-Up: At [facility], you and your health needs are our priority.   As part of our continuing mission to provide you with exceptional heart care, we have created designated Provider Care Teams.  These Care Teams include your primary Cardiologist (physician) and Advanced Practice Providers (APPs -  Physician Assistants and Nurse Practitioners) who all work together to provide you with the care you need, when you need it.  We recommend signing up for the patient portal called "MyChart".  Sign up information is provided on this After Visit Summary.  MyChart is used to connect with patients for Virtual Visits (Telemedicine).  Patients are able to view lab/test results, encounter notes, upcoming appointments, etc.  Non-urgent messages can be sent to your provider as well.   To learn more about what you can do with MyChart, go to [email or web address].    Your next appointment:   As needed  The format for your next appointment:   In Person  Provider:   Lennie Odor, MD     Signed, Shane Martin. Flora Lipps, MD, [facility]  [facility]  [address] [place], [place] [address] [phone]  08/02/2021 4:49 PM

## 2021-08-02 ENCOUNTER — Encounter: Payer: Self-pay | Admitting: Cardiovascular Disease

## 2021-08-02 ENCOUNTER — Other Ambulatory Visit: Payer: Self-pay

## 2021-08-02 ENCOUNTER — Ambulatory Visit (INDEPENDENT_AMBULATORY_CARE_PROVIDER_SITE_OTHER): Payer: BC Managed Care – PPO | Admitting: Cardiovascular Disease

## 2021-08-02 VITALS — BP 126/70 | HR 64 | Resp 20 | Ht 70.0 in | Wt 199.0 lb

## 2021-08-02 DIAGNOSIS — R072 Precordial pain: Secondary | ICD-10-CM | POA: Diagnosis not present

## 2021-08-02 NOTE — Patient Instructions (Signed)
Medication Instructions:  The current medical regimen is effective;  continue present plan and medications.  *If you need a refill on your cardiac medications before your next appointment, please call your pharmacy*    Follow-Up: At [facility], you and your health needs are our priority.  As part of our continuing mission to provide you with exceptional heart care, we have created designated Provider Care Teams.  These Care Teams include your primary Cardiologist (physician) and Advanced Practice Providers (APPs -  Physician Assistants and Nurse Practitioners) who all work together to provide you with the care you need, when you need it.  We recommend signing up for the patient portal called "MyChart".  Sign up information is provided on this After Visit Summary.  MyChart is used to connect with patients for Virtual Visits (Telemedicine).  Patients are able to view lab/test results, encounter notes, upcoming appointments, etc.  Non-urgent messages can be sent to your provider as well.   To learn more about what you can do with MyChart, go to [email or web address].    Your next appointment:   As needed  The format for your next appointment:   In Person  Provider:   Englewood O'Neal, MD      

## 2021-08-05 ENCOUNTER — Other Ambulatory Visit: Payer: Self-pay

## 2021-08-05 ENCOUNTER — Inpatient Hospital Stay: Payer: BC Managed Care – PPO

## 2021-08-05 ENCOUNTER — Encounter: Payer: Self-pay | Admitting: Hematology and Oncology

## 2021-08-05 ENCOUNTER — Inpatient Hospital Stay: Payer: BC Managed Care – PPO | Attending: Hematology and Oncology | Admitting: Hematology and Oncology

## 2021-08-05 VITALS — BP 129/83 | HR 65 | Temp 98.2°F | Resp 18 | Wt 202.1 lb

## 2021-08-05 DIAGNOSIS — Z86718 Personal history of other venous thrombosis and embolism: Secondary | ICD-10-CM | POA: Insufficient documentation

## 2021-08-05 DIAGNOSIS — G08 Intracranial and intraspinal phlebitis and thrombophlebitis: Secondary | ICD-10-CM | POA: Insufficient documentation

## 2021-08-05 DIAGNOSIS — Z7901 Long term (current) use of anticoagulants: Secondary | ICD-10-CM | POA: Diagnosis not present

## 2021-08-05 NOTE — Progress Notes (Signed)
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 Extensive thrombosis of right transverse sinus confirmed by MRI brain and MR venogram of the head. Started on eliquis therapy.  08/05/2021: establish care with Dr. Lorenso Courier   CHIEF COMPLAINTS/PURPOSE OF CONSULTATION:  "Dural Venous Sinus Thrombosis "  HISTORY OF PRESENTING ILLNESS:  Shane Martin 45 y.o. male with medical history significant for prior DVT in December 2020 who presents for evaluation of a dural venous sinus thrombosis.  On review of the previous records Mr. Kallstrom underwent an ultrasound of his lower extremities on 08/23/2019.  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  This was confirmed with MRI brain and MR venogram of the head.  He was started on Eliquis therapy.  Due to concern for these findings he was referred to hematology for further evaluation and management.  On exam today Mr. Worlds is accompanied by his wife.  He reports that the  first episode of DVT he had was approximately 5 years ago when he broke his ankle.  He reports was put in a brace and due to immobility he developed a lower extremity blood clot in the right leg.  He notes that he was on Xarelto for 6 months time and tolerated the therapy quite well.  He did have some symptoms he was not sure if they were related.  He reports that he had some dizziness and "vertigo" while on the medication.  During his most recent episode he has had a headache and neck pain for 5 days duration Department of Emergency Medicine was found to have a dural venous sinus.  On further discussion the patient notes that he has a strong family history of clotting.  Reports that his brother had surgery after testicular cancer and developed pulmonary emboli.  His father had a history of myocardial infarction's and heart disease.  His sister also has a "clotting issue".  He notes that he is unsure about his grandparents.  He reports his mother is of [place] descent and his father is of [place] descent.  He notes he does not currently have any children but his wife is currently pregnant.  Also there is reportedly a history of hemochromatosis on the father side of the family.  In regards to clotting risk the patient notes that he is not a smoker and does not vape or use e-cigarettes.  He also quit alcohol approximately 5 to 10 years ago but could be quite heavy drinker and drank a sixpack in the day.  He quit when he was told he had high liver enzymes at one point in time.  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  He is fairly active and tries to run a few times per week and walks daily.  He is currently tolerating his Eliquis therapy well with no bleeding, bruising, or dark stools.  He does occasionally have episodes of "swelling head".  Full 10 point ROS is listed below.  MEDICAL HISTORY:  Past Medical History:  Diagnosis Date   Anxiety    Asthma    DVT (deep venous thrombosis)  (HCC)    Elevated LFTs    Vertigo     SURGICAL HISTORY: Past Surgical History:  Procedure Laterality Date   No prior surgery      SOCIAL HISTORY: Social History   Socioeconomic History   Marital status: Married    Spouse name: Not on file   Number of children: Not on file   Years of education: Not on file   Highest education level: Not on file  Occupational History   Occupation: Building surveyor    Comment: [employer]  Tobacco Use   Smoking status: Never    Passive exposure: Never   Smokeless tobacco: Never  Vaping Use   Vaping Use: Never used  Substance and Sexual Activity   Alcohol use: No   Drug use: No   Sexual activity: Not on file  Other Topics Concern   Not on file  Social History Narrative   Not on file   Social Determinants of Health   Financial Resource Strain: Not on file  Food Insecurity: Not on file  Transportation Needs: Not on file  Physical Activity: Not on file  Stress: Not on file  Social Connections: Not on file  Intimate Partner Violence: Not on file    FAMILY HISTORY: Family History  Problem Relation Age of Onset   CAD Father 38    ALLERGIES:  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  MEDICATIONS:  Current Outpatient Medications  Medication Sig Dispense Refill   acetaminophen (TYLENOL) 500 MG tablet      albuterol (VENTOLIN HFA) 108 (90 Base) MCG/ACT inhaler Inhale 2 puffs into the lungs every 6 (six) hours as needed (excercise induced asthma).     apixaban (ELIQUIS) 5 MG TABS tablet Take 5 mg by mouth 2 (two) times daily.     atorvastatin (LIPITOR) 40 MG tablet Take 1 tablet (40 mg total) by mouth daily. (Patient not taking: Reported on 08/05/2021) 30 tablet 2   No current facility-administered medications for this visit.    REVIEW OF SYSTEMS:   Constitutional: ( - ) fevers, ( - )  chills , ( - ) night sweats Eyes: ( - ) blurriness of vision, ( - ) double vision, ( - ) watery eyes Ears, nose, mouth, throat, and face: ( -  ) mucositis, ( - ) sore throat Respiratory: ( - ) cough, ( - ) dyspnea, ( - ) wheezes Cardiovascular: ( - ) palpitation, ( - ) chest discomfort, ( - ) lower extremity swelling Gastrointestinal:  ( - ) nausea, ( - ) heartburn, ( - ) change in bowel habits Skin: ( - ) abnormal skin rashes Lymphatics: ( - ) new lymphadenopathy, ( - ) easy bruising Neurological: ( - ) numbness, ( - ) tingling, ( - ) new weaknesses Behavioral/Psych: ( - ) mood change, ( - ) new changes  All other systems were reviewed with the patient and are negative.  PHYSICAL EXAMINATION: ECOG PERFORMANCE STATUS: ECOG 0  Vitals:   08/05/21 0905  BP: 129/83  Pulse: 65  Resp: 18  Temp: 98.2 F (36.8 C)  SpO2: 99%   Filed Weights   08/05/21 0905  Weight: 202 lb 1.6 oz (91.7 kg)    GENERAL: well appearing middle-aged Caucasian male in NAD  SKIN: skin color, texture, turgor are normal, no rashes or significant lesions EYES: conjunctiva are pink and non-injected, sclera clear LUNGS: clear to auscultation and percussion with normal breathing effort HEART: regular rate & rhythm and no murmurs and no lower extremity edema Musculoskeletal: no cyanosis of digits and no clubbing  PSYCH: alert & oriented x 3, fluent speech NEURO: no focal motor/sensory deficits  LABORATORY DATA:  I have reviewed the data as listed CBC Latest Ref Rng & Units 07/20/2021 07/19/2021 07/18/2021  WBC 4.0 - 10.5 K/uL 10.0 11.7(H) 11.9(H)  Hemoglobin 13.0 - 17.0 g/dL 73.2 20.2 54.2  Hematocrit 39.0 - 52.0 % 44.3 45.6 46.0  Platelets 150 - 400 K/uL 224 269 255    CMP Latest Ref Rng & Units 07/18/2021 06/29/2017  Glucose 70 - 99 mg/dL 706(C) 94  BUN 6 - 20 mg/dL 14 13  Creatinine 3.76 - 1.24 mg/dL 2.83 1.51  Sodium 761 - 145 mmol/L 139 139  Potassium 3.5 - 5.1 mmol/L 3.9 4.0  Chloride 98 - 111 mmol/L 100 104  CO2 22 - 32 mmol/L 29 27  Calcium 8.9 - 10.3 mg/dL 9.2 9.0  Total Protein 6.5 - 8.1 g/dL 8.0 -  Total Bilirubin 0.3 - 1.2 mg/dL  0.8 -  Alkaline Phos 38 - 126 U/L 62 -  AST 15 - 41 U/L 27 -  ALT 0 - 44 U/L 45(H) -    RADIOGRAPHIC STUDIES: CT Head Wo Contrast  Result Date: 07/18/2021 CLINICAL DATA:  Provided history: Severe headache. Additional history provided: Patient reports head pain for 6 days, nausea and severe right-sided neck stiffness. EXAM: CT HEAD WITHOUT CONTRAST TECHNIQUE: Contiguous axial images were obtained from the base of the skull through the vertex without intravenous contrast. COMPARISON:  No pertinent prior exams available for comparison. FINDINGS: Brain: Cerebral volume is normal for age. There is subtle ill-defined hyperdensity in the right occipital lobe region, which may reflect trace subarachnoid hemorrhage or parenchymal hemorrhage/hemorrhagic infarction (for instance as seen on series 2, image 17) (series 4, image 14) (series 5, image 18). No evidence of an intracranial mass. No midline shift. Vascular: Asymmetric hyperdensity of the right transverse and sigmoid dural venous sinuses, suspicious for dural venous sinus thrombosis. Skull: Normal. Negative for fracture or focal lesion. Sinuses/Orbits: Visualized orbits show no acute finding. Trace mucosal thickening within the bilateral ethmoid air cells. These results were called by telephone at the time of interpretation on 07/18/2021 at 12:07 pm to provider Tanda Rockers , who verbally acknowledged these results. IMPRESSION: There is asymmetric hyperdensity of the right transverse and sigmoid dural venous sinuses, highly suspicious for dural venous sinus thrombosis. Additionally, there is suggestion of subtle hyperdensity in the right occipital lobe region, which trace subarachnoid hemorrhage or parenchymal hemorrhage/hemorrhagic infarction. An MRI brain and MRV head without and with contrast are recommended for further evaluation. Electronically Signed   By: Jackey Loge D.O.   On: 07/18/2021 12:10   MR BRAIN WO CONTRAST  Result Date:  07/18/2021 CLINICAL DATA:  Headache. Personal history of DVT. Dural venous sinus thrombosis suspected. EXAM: MRI HEAD WITHOUT CONTRAST MR VENOGRAM HEAD WITHOUT AND WITH CONTRAST TECHNIQUE: Multiplanar, multi-echo pulse sequences of the brain and surrounding structures were acquired without and with intravenous contrast. Angiographic images of the intracranial venous structures were  acquired using MRV technique without and with intravenous contrast. CONTRAST:  9.57mL GADAVIST GADOBUTROL 1 MMOL/ML IV SOLN COMPARISON:  CT head without contrast 07/18/2021 FINDINGS: MRI HEAD WITHOUT AND WITH CONTRAST Brain: Diffusion-weighted images demonstrate no acute infarct. No acute hemorrhage or mass lesion is present. No significant white matter lesions are present. The ventricles are of normal size. No significant extraaxial fluid collection is present. The internal auditory canals are within normal limits. The brainstem and cerebellum are within normal limits Vascular: Marked susceptibility artifact is evident adjacent to the right transverse sinus. Flow is present in the major intracranial arteries. Skull and upper cervical spine: The craniocervical junction is normal. Upper cervical spine is within normal limits. Marrow signal is unremarkable. Sinuses/Orbits: Small amount of fluid is present in the mastoid air cells bilaterally. No obstructing nasopharyngeal lesion is present. The paranasal sinuses and mastoid air cells are otherwise clear. The globes and orbits are within normal limits. MR VENOGRAM HEAD WITHOUT AND WITH CONTRAST Pre and postcontrast images demonstrate extensive thrombosis in the right transverse sinus through the sigmoid sinus and into the upper right internal jugular sinus. No associated hemorrhage. The dural sinuses are otherwise patent. The left transverse sinus is patent. IMPRESSION: 1. Extensive thrombosis of the right transverse sinus through the sigmoid sinus and into the upper right internal jugular  sinus. 2. No associated hemorrhage. 3. Normal MRI appearance of the brain. 4. Small amount of fluid in the mastoid air cells bilaterally. Electronically Signed   By: San Morelle M.D.   On: 07/18/2021 16:54   MR Venogram Head  Result Date: 07/18/2021 CLINICAL DATA:  Headache. Personal history of DVT. Dural venous sinus thrombosis suspected. EXAM: MRI HEAD WITHOUT CONTRAST MR VENOGRAM HEAD WITHOUT AND WITH CONTRAST TECHNIQUE: Multiplanar, multi-echo pulse sequences of the brain and surrounding structures were acquired without and with intravenous contrast. Angiographic images of the intracranial venous structures were acquired using MRV technique without and with intravenous contrast. CONTRAST:  9.79mL GADAVIST GADOBUTROL 1 MMOL/ML IV SOLN COMPARISON:  CT head without contrast 07/18/2021 FINDINGS: MRI HEAD WITHOUT AND WITH CONTRAST Brain: Diffusion-weighted images demonstrate no acute infarct. No acute hemorrhage or mass lesion is present. No significant white matter lesions are present. The ventricles are of normal size. No significant extraaxial fluid collection is present. The internal auditory canals are within normal limits. The brainstem and cerebellum are within normal limits Vascular: Marked susceptibility artifact is evident adjacent to the right transverse sinus. Flow is present in the major intracranial arteries. Skull and upper cervical spine: The craniocervical junction is normal. Upper cervical spine is within normal limits. Marrow signal is unremarkable. Sinuses/Orbits: Small amount of fluid is present in the mastoid air cells bilaterally. No obstructing nasopharyngeal lesion is present. The paranasal sinuses and mastoid air cells are otherwise clear. The globes and orbits are within normal limits. MR VENOGRAM HEAD WITHOUT AND WITH CONTRAST Pre and postcontrast images demonstrate extensive thrombosis in the right transverse sinus through the sigmoid sinus and into the upper right internal  jugular sinus. No associated hemorrhage. The dural sinuses are otherwise patent. The left transverse sinus is patent. IMPRESSION: 1. Extensive thrombosis of the right transverse sinus through the sigmoid sinus and into the upper right internal jugular sinus. 2. No associated hemorrhage. 3. Normal MRI appearance of the brain. 4. Small amount of fluid in the mastoid air cells bilaterally. Electronically Signed   By: San Morelle M.D.   On: 07/18/2021 16:54    ASSESSMENT & PLAN Joson Ditto 45 y.o.  male with medical history significant for prior DVT in 2017 who presents for evaluation of a dural venous sinus thrombosis.  After review of the labs, review of the records, and discussion with the patient the patients findings are most consistent with a provoked right lower extremity DVT and a dural venous sinus thrombosis.  Given his history of easy clotting I would recommend indefinite anticoagulation.  Typically the recommendation would be for limited duration of therapy for dural venous sinus thrombosis, but given his prior history of VTE I would recommend lifelong duration of therapy.  It appears he has quite prone to clotting.  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  Mr. Kennith Gain and his wife voiced understanding of this plan moving forward.  We will continue with Eliquis 5 mg twice daily p.o. until the 60-month mark where we have the option to drop down to maintenance dosing.  # Dural Venous Sinus Thrombosis # Prior History of VTE -- Agree with continued Eliquis 5 mg p.o. twice daily.  Can consider transition to 2.5 mg twice daily maintenance dosing after 6 months time --Given his prior episode of VTE and this new unusual thrombosis strongly recommend indefinite anticoagulation --prior hypercoagulable work-up included antiphospholipid antibody syndrome, protein C, protein S  deficiencies, as well as heritable coagulopathies. He was noted to be heterozygous for prothrombin gene mutation. We discussed the modest increase risk in thrombosis with this condition.  --We will plan to see the patient back at approximately the 57-month mark to discuss transitioning to maintenance dose therapy.  No orders of the defined types were placed in this encounter.   All questions were answered. The patient knows to call the clinic with any problems, questions or concerns.  A total of more than 60 minutes were spent on this encounter with face-to-face time and non-face-to-face time, including preparing to see the patient, ordering tests and/or medications, counseling the patient and coordination of care as outlined above.   Ledell Peoples, MD Department of Hematology/Oncology [facility] Phone: [phone] Pager: [phone] Email: [email or web address]  08/05/2021 12:30 PM

## 2021-08-24 ENCOUNTER — Encounter: Payer: Self-pay | Admitting: Adult Health

## 2021-08-24 ENCOUNTER — Ambulatory Visit (INDEPENDENT_AMBULATORY_CARE_PROVIDER_SITE_OTHER): Payer: BC Managed Care – PPO | Admitting: Adult Health

## 2021-08-24 VITALS — BP 126/72 | HR 65 | Ht 70.0 in | Wt 203.0 lb

## 2021-08-24 DIAGNOSIS — G08 Intracranial and intraspinal phlebitis and thrombophlebitis: Secondary | ICD-10-CM

## 2021-08-24 DIAGNOSIS — D6859 Other primary thrombophilia: Secondary | ICD-10-CM

## 2021-08-24 DIAGNOSIS — E785 Hyperlipidemia, unspecified: Secondary | ICD-10-CM | POA: Diagnosis not present

## 2021-08-24 MED ORDER — ATORVASTATIN CALCIUM 20 MG PO TABS: 20.0000 mg | ORAL_TABLET | Freq: Every day | ORAL | 5 refills | Status: DC

## 2021-08-24 NOTE — Patient Instructions (Addendum)
Continue Eliquis (apixaban) daily with routine follow up with hematology  Start atorvastatin 20mg  daily - we will check cholesterol levels today  We will plan on repeat imaging around Jan/Feb of next year     Followup in the future with me in 6 months or call earlier if needed       Thank you for coming to see at [facility]. I hope we have been able to provide you high quality care today.  You may receive a patient satisfaction survey over the next few weeks. We would appreciate your feedback and comments so that we may continue to improve ourselves and the health of our patients.

## 2021-08-24 NOTE — Progress Notes (Signed)
[facility] [address] [place]. [place] [address] [phone]       HOSPITAL FOLLOW UP NOTE  [name] Date of Birth:  1975-09-26 Medical Record Number:  [id number]   Reason for Referral:  hospital stroke follow up    SUBJECTIVE:   CHIEF COMPLAINT:  Chief Complaint  Patient presents with   Follow-up    RM 2 with spouse Shane Martin PT is well, occasional very slight headaches like .5 out of 10. Also having something going on with swallowing like he feels like something is clicking when he hold his head a certain way when he swallows. Also mentions ringing in ears.    HPI:   [name] is a 45 y.o. male with history of anxiety, asthma, DVT, transaminitis, and vertigo who presented to ED on 07/18/2021 with c/o HA x 5 days accompanied by n/v. He described the HA as constant, worsening, achy, sharp at times, and throbbing. He initially thought it was "sinus" and took Ibuprofen with mild improvement. He has not recently had a fever, chills, diarrhea, viral illness, cold, or COVID. Associated symptoms include neck pain.  Personally reviewed hospitalization pertinent progress notes, lab work and imaging.  Evaluated by Dr. 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  MRI negative.  MRV showed extensive thrombosis of the right transverse sinus through the sigmoid sinus and into the upper right internal jugular sinus.  Hypercoagulable work-up pending at discharge.  LDL 115.  A1c 5.2.  Initially on heparin IV and transitioned to Eliquis 5 mg twice daily.  Prior history of DVT 5 years ago in setting of ankle injury and recently received COVID booster 2 weeks prior.  Advised to follow-up with hematology and neurology after discharge.  Today, 08/24/2021, patient being seen for initial hospital follow-up accompanied by his wife, Shane Martin.   Overall doing well since discharge and gradually improving.  Does report occasional very slight headache. Doesn't  stop him from working or it not debilitating.  Has experienced a zap type sensation on the right side of his head very slight and only a couple times. Also reports right ear tinnitus and possibly present prior. Also reports occasional "clicking" sensation when he swallows his saliva while laying in bed. He did not notice this prior.  Denies actual difficulty swallowing.  Denies any visual changes, neck discomfort or cognitive changes.  Compliant on Eliquis without side effects Evaluated by hematology who recommended indefinite anticoagulation.  He did not start atorvastatin at discharge as he was unsure if this was really needed.  He is willing to start if needed. Blood pressure today 126/72 Denies any tobacco or EtOH use  He and his wife have multiple appropriate questions regarding activity level and any restrictions. He has been going on some walks but wondering if he can start getting back in to running and weight lifting. Also questions ability to do things around the home such as painting and remodeling (they found out they were pregnant a week and after he was discharged home!!).  No further concerns at this time     PERTINENT IMAGING  Per recent hospitalization CT head asymmetric hyperdensity of the right transverse and sigmoid dural venous sinuses, highly suspicious for dural venous sinus thrombosis.  MRI  normal, no infarct or ICH MRV Extensive thrombosis of the right transverse sinus through the sigmoid sinus and into the upper right internal jugular sinus. LDL 115 HgbA1c 5.2    ROS:   14 system review of systems  performed and negative with exception of those listed in HPI  PMH:  Past Medical History:  Diagnosis Date   Anxiety    Asthma    DVT (deep venous thrombosis) (HCC)    Elevated LFTs    Vertigo     PSH:  Past Surgical History:  Procedure Laterality Date   No prior surgery      Social History:  Social History   Socioeconomic History   Marital status:  Married    Spouse name: Not on file   Number of children: Not on file   Years of education: Not on file   Highest education level: Not on file  Occupational History   Occupation: Field seismologist    Comment: [employer]  Tobacco Use   Smoking status: Never    Passive exposure: Never   Smokeless tobacco: Never  Vaping Use   Vaping Use: Never used  Substance and Sexual Activity   Alcohol use: No   Drug use: No   Sexual activity: Not on file  Other Topics Concern   Not on file  Social History Narrative   Not on file   Social Determinants of Health   Financial Resource Strain: Not on file  Food Insecurity: Not on file  Transportation Needs: Not on file  Physical Activity: Not on file  Stress: Not on file  Social Connections: Not on file  Intimate Partner Violence: Not on file    Family History:  Family History  Problem Relation Age of Onset   CAD Father 86    Medications:   Current Outpatient Medications on File Prior to Visit  Medication Sig Dispense Refill   albuterol (VENTOLIN HFA) 108 (90 Base) MCG/ACT inhaler Inhale 2 puffs into the lungs every 6 (six) hours as needed (excercise induced asthma).     apixaban (ELIQUIS) 5 MG TABS tablet Take 5 mg by mouth 2 (two) times daily.     No current facility-administered medications on file prior to visit.    Allergies:   Allergies  Allergen Reactions   Cats Claw (Uncaria Tomentosa) Other (See Comments)    Allergy test revealed multiple environmental allergies - grass, pollen, etc -  Watery eyes      OBJECTIVE:  Physical Exam  Vitals:   08/24/21 1516  BP: 126/72  Pulse: 65  Weight: 203 lb (92.1 kg)  Height: 5\' 10"  (1.778 m)   Body mass index is 29.13 kg/m. No results found.  General: well developed, well nourished, very pleasant middle-age Caucasian male, seated, in no evident distress Head: head normocephalic and atraumatic.   Neck: supple with no carotid or supraclavicular bruits Cardiovascular:  regular rate and rhythm, no murmurs Musculoskeletal: no deformity Skin:  no rash/petichiae Vascular:  Normal pulses all extremities   Neurologic Exam Mental Status: Awake and fully alert.  Fluent speech and language.  Oriented to place and time. Recent and remote memory intact. Attention span, concentration and fund of knowledge appropriate. Mood and affect appropriate.  Cranial Nerves: Fundoscopic exam reveals sharp disc margins. Pupils equal, briskly reactive to light. Extraocular movements full without nystagmus. Visual fields full to confrontation. Hearing intact. Facial sensation intact. Face, tongue, palate moves normally and symmetrically.  Motor: Normal bulk and tone. Normal strength in all tested extremity muscles Sensory.: intact to touch , pinprick , position and vibratory sensation.  Coordination: Rapid alternating movements normal in all extremities. Finger-to-nose and heel-to-shin performed accurately bilaterally. Gait and Station: Arises from chair without difficulty. Stance is normal. Gait demonstrates normal  stride length and balance without use of AD. Tandem walk and heel toe without difficulty.  Reflexes: 1+ and symmetric. Toes downgoing.         ASSESSMENT: Shane Martin is a 45 y.o. 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 Vascular risk factors include HLD and history of DVT.      PLAN:  CVST :  mild slight occasional headache but overall greatly improving.  Continue to monitor.   Continue Eliquis (apixaban) daily and routine f/u with hematology recommended lifelong anticoagulation given prior episode of VTE Plan on repeat MRV 3-4 months (around 10/2021) Hypercoagulable labs: most WNL. 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 Also discussed precautions/restrictions - would not recommend any strenuous activity or exercise at least until follow up imaging completed - okay to go for brisk walks and light weight lifting HLD: LDL goal <70. Recent LDL 115.  Discussed indication for statin and recommend starting atorvastatin 20 mg daily.  Will check baseline lipid panel today.  Tinnitus: chronic issue.  Advised to continue to monitor and to f/u with PCP for possible ENT eval if persistent Throat "clicking": unknown etiology. Do not believe related to CVST although not present prior. Advised to continue to monitor and to f/u with PCP if persists for possible ENT eval    Follow up in 6 months or call earlier if needed   CC:  [facility] provider: Dr. Pearlean Brownie PCP: Darrow Bussing, MD    I spent 59 minutes of face-to-face and non-face-to-face time with patient and wife.  This included previsit chart review including review of recent hospitalization, lab review, study review, order entry, electronic health record documentation, patient and wife education and discussion regarding recent CVST including etiology, mild residual headaches and continued precautions, use of above medications, other concerns as noted above and answered all other questions to patient and wife's satisfaction   Ihor Austin, AGNP-BC  [facility] [address] [place], [place] [address]  Phone [phone] Fax [phone] Note: This document was prepared with digital dictation and possible smart phrase technology. Any transcriptional errors that result from this process are unintentional.

## 2021-08-25 ENCOUNTER — Telehealth: Payer: Self-pay

## 2021-08-25 ENCOUNTER — Ambulatory Visit: Payer: BC Managed Care – PPO | Admitting: Cardiology

## 2021-08-25 LAB — LIPID PANEL
Chol/HDL Ratio: 6.1 ratio — ABNORMAL HIGH (ref 0.0–5.0)
Cholesterol, Total: 239 mg/dL — ABNORMAL HIGH (ref 100–199)
HDL: 39 mg/dL — ABNORMAL LOW (ref >39)
LDL Chol Calc (NIH): 123 mg/dL — ABNORMAL HIGH (ref 0–99)
Triglycerides: 434 mg/dL — ABNORMAL HIGH (ref 0–149)
VLDL Cholesterol Cal: 77 mg/dL — ABNORMAL HIGH (ref 5–40)

## 2021-08-25 NOTE — Telephone Encounter (Signed)
I called the pt and advised of results of cholesterol levels.  Pt verbalized understanding and agreement for repeat labs.  Pt sts he will plan to have labs completed on 08/30/2021.

## 2021-08-25 NOTE — Telephone Encounter (Signed)
-----   Message from Ihor Austin, NP sent at 08/25/2021  9:56 AM EST ----- recent cholesterol levels were quite elevated - would like to repeat this when fasting. Can you please see if patient is able to come in tomorrow or sometime next week for repeat lab? Thank you!

## 2021-08-27 NOTE — Progress Notes (Signed)
I agree with the above plan 

## 2021-08-30 ENCOUNTER — Other Ambulatory Visit (INDEPENDENT_AMBULATORY_CARE_PROVIDER_SITE_OTHER): Payer: Self-pay

## 2021-08-30 ENCOUNTER — Other Ambulatory Visit: Payer: Self-pay | Admitting: *Deleted

## 2021-08-30 DIAGNOSIS — D6859 Other primary thrombophilia: Secondary | ICD-10-CM | POA: Diagnosis not present

## 2021-08-30 DIAGNOSIS — E785 Hyperlipidemia, unspecified: Secondary | ICD-10-CM

## 2021-08-30 DIAGNOSIS — G08 Intracranial and intraspinal phlebitis and thrombophlebitis: Secondary | ICD-10-CM | POA: Diagnosis not present

## 2021-08-30 DIAGNOSIS — Z0289 Encounter for other administrative examinations: Secondary | ICD-10-CM

## 2021-08-31 LAB — LIPID PANEL
Chol/HDL Ratio: 4.1 ratio (ref 0.0–5.0)
Cholesterol, Total: 185 mg/dL (ref 100–199)
HDL: 45 mg/dL (ref >39)
LDL Chol Calc (NIH): 111 mg/dL — ABNORMAL HIGH (ref 0–99)
Triglycerides: 167 mg/dL — ABNORMAL HIGH (ref 0–149)
VLDL Cholesterol Cal: 29 mg/dL (ref 5–40)

## 2021-09-17 ENCOUNTER — Other Ambulatory Visit: Payer: Self-pay | Admitting: *Deleted

## 2021-09-17 MED ORDER — APIXABAN 5 MG PO TABS: 5.0000 mg | ORAL_TABLET | Freq: Two times a day (BID) | ORAL | 1 refills | Status: DC

## 2021-09-19 ENCOUNTER — Encounter (HOSPITAL_BASED_OUTPATIENT_CLINIC_OR_DEPARTMENT_OTHER): Payer: Self-pay

## 2021-09-19 ENCOUNTER — Emergency Department (HOSPITAL_BASED_OUTPATIENT_CLINIC_OR_DEPARTMENT_OTHER)
Admission: EM | Admit: 2021-09-19 | Discharge: 2021-09-19 | Disposition: A | Discharge status: Home / Self Care | Payer: BC Managed Care – PPO | Attending: Emergency Medicine | Admitting: Emergency Medicine

## 2021-09-19 ENCOUNTER — Emergency Department (HOSPITAL_BASED_OUTPATIENT_CLINIC_OR_DEPARTMENT_OTHER): Payer: BC Managed Care – PPO

## 2021-09-19 DIAGNOSIS — J45909 Unspecified asthma, uncomplicated: Secondary | ICD-10-CM | POA: Insufficient documentation

## 2021-09-19 DIAGNOSIS — W228XXA Striking against or struck by other objects, initial encounter: Secondary | ICD-10-CM | POA: Insufficient documentation

## 2021-09-19 DIAGNOSIS — Z7901 Long term (current) use of anticoagulants: Secondary | ICD-10-CM | POA: Insufficient documentation

## 2021-09-19 DIAGNOSIS — S0990XA Unspecified injury of head, initial encounter: Secondary | ICD-10-CM | POA: Insufficient documentation

## 2021-09-19 HISTORY — DX: Prothrombin gene mutation: D68.52

## 2021-09-19 HISTORY — DX: Intracranial and intraspinal phlebitis and thrombophlebitis: G08

## 2021-09-19 NOTE — ED Triage Notes (Signed)
Pt states that he was at home and accidentally bumped the L side of his head on a piece of furniture. Pt denies LOC, N/V, vision changes. Pt is on 5 mg eliquis BID.

## 2021-09-19 NOTE — ED Notes (Signed)
Pt verbalizes understanding of discharge instructions. Opportunity for questioning and answers were provided. Pt discharged from ED to home.   ? ?

## 2021-09-19 NOTE — Discharge Instructions (Addendum)
CT without evidence of any significant head injury.  Continue all your medications including the Eliquis.  Follow-up with your doctors.

## 2021-09-19 NOTE — ED Provider Notes (Signed)
[facility] EMERGENCY DEPT Provider Note   CSN: [id number] Arrival date & time: 09/19/21  1620     History  Chief Complaint  Patient presents with   Head Injury    Maxey Colberg is a 46 y.o. male.  Patient at home accidentally hit left side of his head around the ear on a piece of furniture.  No loss of consciousness no nausea vomiting no visual changes.  Patient is on Eliquis for dural venous sinus thrombosis.  And patient also has prothrombin gene mutation.  Past medical history otherwise significant for asthma.  Patient denies any numbness or any weakness.  No neck pain.      Home Medications Prior to Admission medications   Medication Sig Start Date End Date Taking? Authorizing Provider  albuterol (VENTOLIN HFA) 108 (90 Base) MCG/ACT inhaler Inhale 2 puffs into the lungs every 6 (six) hours as needed (excercise induced asthma).    [provider]  apixaban (ELIQUIS) 5 MG TABS tablet Take 1 tablet (5 mg total) by mouth 2 (two) times daily. 09/17/21   Orson Slick, MD  atorvastatin (LIPITOR) 20 MG tablet Take 1 tablet (20 mg total) by mouth daily. 08/24/21   Frann Rider, NP      Allergies    Cats claw (uncaria tomentosa)    Review of Systems   Review of Systems  Constitutional:  Negative for chills and fever.  HENT:  Negative for hearing loss, rhinorrhea and sore throat.   Eyes:  Negative for photophobia and visual disturbance.  Respiratory:  Negative for cough and shortness of breath.   Cardiovascular:  Negative for chest pain and leg swelling.  Gastrointestinal:  Negative for abdominal pain, diarrhea, nausea and vomiting.  Genitourinary:  Negative for dysuria.  Musculoskeletal:  Negative for back pain and neck pain.  Skin:  Negative for rash.  Neurological:  Negative for dizziness, facial asymmetry, weakness, light-headedness and headaches.  Hematological:  Does not bruise/bleed easily.  Psychiatric/Behavioral:  Negative for confusion.     Physical Exam Updated Vital Signs BP 117/81    Pulse (!) 52    Temp (!) 97.5 F (36.4 C)    Resp 18    Ht 1.778 m (5\' 10" )    Wt 93 kg    SpO2 100%    BMI 29.41 kg/m  Physical Exam Vitals and nursing note reviewed.  Constitutional:      General: He is not in acute distress.    Appearance: He is well-developed.  HENT:     Head: Normocephalic and atraumatic.     Right Ear: Tympanic membrane, ear canal and external ear normal.     Left Ear: Tympanic membrane, ear canal and external ear normal.  Eyes:     Extraocular Movements: Extraocular movements intact.     Conjunctiva/sclera: Conjunctivae normal.     Pupils: Pupils are equal, round, and reactive to light.  Cardiovascular:     Rate and Rhythm: Normal rate and regular rhythm.     Heart sounds: No murmur heard. Pulmonary:     Effort: Pulmonary effort is normal. No respiratory distress.     Breath sounds: Normal breath sounds.  Abdominal:     Palpations: Abdomen is soft.     Tenderness: There is no abdominal tenderness.  Musculoskeletal:        General: No swelling.     Cervical back: Neck supple.  Skin:    General: Skin is warm and dry.     Capillary Refill: Capillary  refill takes less than 2 seconds.  Neurological:     General: No focal deficit present.     Mental Status: He is alert and oriented to person, place, and time.     Cranial Nerves: No cranial nerve deficit.     Sensory: No sensory deficit.     Motor: No weakness.  Psychiatric:        Mood and Affect: Mood normal.    ED Results / Procedures / Treatments   Labs (all labs ordered are listed, but only abnormal results are displayed) Labs Reviewed - No data to display  EKG None  Radiology CT Head Wo Contrast  Result Date: 09/19/2021 CLINICAL DATA:  Head trauma, minor, normal mental status (Age 2-64y) Head injury, minor. Anticoagulated on Eliquis EXAM: CT HEAD WITHOUT CONTRAST TECHNIQUE: Contiguous axial images were obtained from the base of the skull  through the vertex without intravenous contrast. COMPARISON:  None. FINDINGS: Brain: No evidence of large-territorial acute infarction. No parenchymal hemorrhage. No mass lesion. No extra-axial collection. No mass effect or midline shift. No hydrocephalus. Basilar cisterns are patent. Vascular: No hyperdense vessel. Skull: No acute fracture or focal lesion. Sinuses/Orbits: Paranasal sinuses and mastoid air cells are clear. The orbits are unremarkable. Other: None. IMPRESSION: No acute intracranial abnormality. Electronically Signed   By: Iven Finn M.D.   On: 09/19/2021 17:45    Procedures Procedures    Medications Ordered in ED Medications - No data to display  ED Course/ Medical Decision Making/ A&P                           Medical Decision Making  Findings consistent with minor head injury.  Head CT without any acute findings.  Very reassuring.  Patient's left pinna without any evidence of hematoma.  Ear canals normal tympanic membranes normal.  Patient without any concussive type symptoms at this time.  Patient stable for discharge.   Final Clinical Impression(s) / ED Diagnoses Final diagnoses:  Minor head injury, initial encounter    Rx / DC Orders ED Discharge Orders     None         Fredia Sorrow, MD 09/19/21 2129

## 2021-10-07 ENCOUNTER — Telehealth: Payer: Self-pay | Admitting: Adult Health

## 2021-10-07 DIAGNOSIS — K219 Gastro-esophageal reflux disease without esophagitis: Secondary | ICD-10-CM | POA: Diagnosis not present

## 2021-10-07 DIAGNOSIS — J3489 Other specified disorders of nose and nasal sinuses: Secondary | ICD-10-CM | POA: Diagnosis not present

## 2021-10-07 DIAGNOSIS — J343 Hypertrophy of nasal turbinates: Secondary | ICD-10-CM | POA: Diagnosis not present

## 2021-10-07 NOTE — Telephone Encounter (Signed)
I don't see an MRI ordered yet?

## 2021-10-07 NOTE — Telephone Encounter (Signed)
Pt states he was told by Shanda Bumps to call in Jan to schedule a 2nd MRI, please call.

## 2021-10-11 ENCOUNTER — Other Ambulatory Visit: Payer: Self-pay | Admitting: Adult Health

## 2021-10-11 DIAGNOSIS — G08 Intracranial and intraspinal phlebitis and thrombophlebitis: Secondary | ICD-10-CM

## 2021-10-11 NOTE — Telephone Encounter (Signed)
MRV Head wo contrast [facility] Josem Kaufmann: [id number] (exp. 10/11/21 to 12/09/21).  Patient is scheduled at [facility] for  10/27/21.

## 2021-10-11 NOTE — Telephone Encounter (Signed)
Noted, thank you

## 2021-10-11 NOTE — Telephone Encounter (Signed)
Order placed - request scheduling beginning of February. Thank you

## 2021-10-25 ENCOUNTER — Other Ambulatory Visit: Payer: Self-pay | Admitting: Adult Health

## 2021-10-25 NOTE — Progress Notes (Signed)
Error- please disregard

## 2021-10-27 ENCOUNTER — Ambulatory Visit (INDEPENDENT_AMBULATORY_CARE_PROVIDER_SITE_OTHER): Payer: BC Managed Care – PPO

## 2021-10-27 DIAGNOSIS — G08 Intracranial and intraspinal phlebitis and thrombophlebitis: Secondary | ICD-10-CM

## 2021-11-01 ENCOUNTER — Telehealth: Payer: Self-pay | Admitting: Adult Health

## 2021-11-01 NOTE — Telephone Encounter (Signed)
At 10:36 pt left a vm re: the MRI that he had last week, pt states the MRI results are on my chart but he is unclear as to what the results mean.  Pt is asking for an appointment.  Please review to determine if an appointment or a call back with results is preferred.

## 2021-11-01 NOTE — Telephone Encounter (Signed)
Could you take a look at this, please advise

## 2021-11-03 NOTE — Telephone Encounter (Signed)
Sent results to Dr. Pearlean Brownie - awaiting reply for further recommendations. Can schedule f/u visit with Dr. Pearlean Brownie to further discuss results and other concerns as listed - can send to POD 2 to help work him in on Dr. Marlis Edelson schedule. Once I hear from Dr. Pearlean Brownie, will update patient at that time. Thank you.

## 2021-11-03 NOTE — Telephone Encounter (Signed)
Any update on results ?

## 2021-11-03 NOTE — Telephone Encounter (Signed)
I spoke to the patient. He has been scheduled for 11/09/21 w/ Dr. Pearlean Brownie.

## 2021-11-09 ENCOUNTER — Ambulatory Visit: Payer: BC Managed Care – PPO | Admitting: Neurology

## 2021-11-24 ENCOUNTER — Encounter: Payer: Self-pay | Admitting: Neurology

## 2021-11-24 ENCOUNTER — Ambulatory Visit (INDEPENDENT_AMBULATORY_CARE_PROVIDER_SITE_OTHER): Payer: BC Managed Care – PPO | Admitting: Neurology

## 2021-11-24 VITALS — BP 132/74 | HR 77 | Ht 70.0 in | Wt 207.0 lb

## 2021-11-24 DIAGNOSIS — G44209 Tension-type headache, unspecified, not intractable: Secondary | ICD-10-CM

## 2021-11-24 DIAGNOSIS — G08 Intracranial and intraspinal phlebitis and thrombophlebitis: Secondary | ICD-10-CM

## 2021-11-24 NOTE — Progress Notes (Signed)
[facility] [address] [place].  [address] [phone]       HOSPITAL FOLLOW UP NOTE  [name] Date of Birth:  [birth year] Medical Record Number:  [id number]   Reason for Referral:  hospital stroke follow up    SUBJECTIVE:   CHIEF COMPLAINT:  Chief Complaint  Patient presents with   Follow-up    Room 17 w/ wife, Shane Martin. Acute CVST assoc w/ thrombophilia. Follow up for further review test results and discuss plan.     HPI:   Last visit 08/24/2021 Shane Bumps, NP) :[name] is a 46 y.o. male with history of anxiety, asthma, DVT, transaminitis, and vertigo who presented to ED on 07/18/2021 with c/o HA x 5 days accompanied by n/v. He described the HA as constant, worsening, achy, sharp at times, and throbbing. He initially thought it was "sinus" and took Ibuprofen with mild improvement. He has not recently had a fever, chills, diarrhea, viral illness, cold, or COVID. Associated symptoms include neck pain.  Personally reviewed hospitalization pertinent progress notes, lab work and imaging.  Evaluated by Dr. 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  MRI negative.  MRV showed extensive thrombosis of the right transverse sinus through the sigmoid sinus and into the upper right internal jugular sinus.  Hypercoagulable work-up pending at discharge.  LDL 115.  A1c 5.2.  Initially on heparin IV and transitioned to Eliquis 5 mg twice daily.  Prior history of DVT 5 years ago in setting of ankle injury and recently received COVID booster 2 weeks prior.  Advised to follow-up with hematology and neurology after discharge.  Today, 08/24/2021, patient being seen for initial hospital follow-up accompanied by his wife, Shane Martin.   Overall doing well since discharge and gradually improving.  Does report occasional very slight headache. Doesn't stop him from working or it not debilitating.  Has experienced a zap type sensation on the right side of  his head very slight and only a couple times. Also reports right ear tinnitus and possibly present prior. Also reports occasional "clicking" sensation when he swallows his saliva while laying in bed. He did not notice this prior.  Denies actual difficulty swallowing.  Denies any visual changes, neck discomfort or cognitive changes.  Compliant on Eliquis without side effects Evaluated by hematology who recommended indefinite anticoagulation.  He did not start atorvastatin at discharge as he was unsure if this was really needed.  He is willing to start if needed. Blood pressure today 126/72 Denies any tobacco or EtOH use  He and his wife have multiple appropriate questions regarding activity level and any restrictions. He has been going on some walks but wondering if he can start getting back in to running and weight lifting. Also questions ability to do things around the home such as painting and remodeling (they found out they were pregnant a week and after he was discharged home!!).  No further concerns at this time     PERTINENT IMAGING  Per recent hospitalization CT head asymmetric hyperdensity of the right transverse and sigmoid dural venous sinuses, highly suspicious for dural venous sinus thrombosis.  MRI  normal, no infarct or ICH MRV Extensive thrombosis of the right transverse sinus through the sigmoid sinus and into the upper right internal jugular sinus. LDL 115 HgbA1c 5.2  Update 11/24/2021 : He returns for follow-up after last visit 3 months ago.  He is accompanied by his wife.  Continues to do well and recurrent neurovascular symptoms.  Did  have mild posterior headache for a month or 2 after episode of cerebral venous sinus in October 20 improved.  Occasionally gets a minor headache described as a sensation of pressure while bending down but it  does not stay long and has not needed any medicines.  He had a follow-up MR venogram of the brain done on 10/27/2021 which showed trace  occult flow in the transverse sinus persistent fluid in the right sigmoid and jugular vein.  Patient is extremely anxious about going back to his physical activity, work schedule and planning to start a family soon.  He has no other complaints.  He does follow-up with hematologist Dr. Leonides Martin who has told him that he likely needs lifelong anticoagulation given his previous history of provoked DVT following ankle surgery as well as now cerebral venous thrombosis episode last year heterozygote patient prothrombin gene mutation.  ROS:   14 system review of systems performed and negative with exception of those listed in HPI  PMH:  Past Medical History:  Diagnosis Date   Anxiety    Asthma    Dural venous sinus thrombosis    DVT (deep venous thrombosis) (HCC)    Elevated LFTs    Prothrombin gene mutation (HCC)    Vertigo     PSH:  Past Surgical History:  Procedure Laterality Date   No prior surgery      Social History:  Social History   Socioeconomic History   Marital status: Married    Spouse name: Not on file   Number of children: Not on file   Years of education: Not on file   Highest education level: Not on file  Occupational History   Occupation: Field seismologistroduct Manger    Comment: [employer]  Tobacco Use   Smoking status: Never    Passive exposure: Never   Smokeless tobacco: Never  Vaping Use   Vaping Use: Never used  Substance and Sexual Activity   Alcohol use: No   Drug use: No   Sexual activity: Not on file  Other Topics Concern   Not on file  Social History Narrative   Not on file   Social Determinants of Health   Financial Resource Strain: Not on file  Food Insecurity: Not on file  Transportation Needs: Not on file  Physical Activity: Not on file  Stress: Not on file  Social Connections: Not on file  Intimate Partner Violence: Not on file    Family History:  Family History  Problem Relation Age of Onset   CAD Father [id number]    Medications:   Current  Outpatient Medications on File Prior to Visit  Medication Sig Dispense Refill   albuterol (VENTOLIN HFA) 108 (90 Base) MCG/ACT inhaler Inhale 2 puffs into the lungs every 6 (six) hours as needed (excercise induced asthma).     apixaban (ELIQUIS) 5 MG TABS tablet Take 1 tablet (5 mg total) by mouth 2 (two) times daily. 180 tablet 1   atorvastatin (LIPITOR) 20 MG tablet Take 1 tablet (20 mg total) by mouth daily. 30 tablet 5   No current facility-administered medications on file prior to visit.    Allergies:   Allergies  Allergen Reactions   Cats Claw (Uncaria Tomentosa) Other (See Comments)    Allergy test revealed multiple environmental allergies - grass, pollen, cats, etc -  Watery eyes      OBJECTIVE:  Physical Exam  Vitals:   11/24/21 1235  BP: 132/74  Pulse: 77  Weight: 207 lb (93.9 kg)  Height: 5\' 10"  (1.778 m)   Body mass index is 29.7 kg/m. No results found.  General: well developed, well nourished, very pleasant middle-age Caucasian male, seated, in no evident distress.  Seems anxious Head: head normocephalic and atraumatic.   Neck: supple with no carotid or supraclavicular bruits Cardiovascular: regular rate and rhythm, no murmurs Musculoskeletal: no deformity Skin:  no rash/petichiae Vascular:  Normal pulses all extremities   Neurologic Exam Mental Status: Awake and fully alert.  Fluent speech and language.  Oriented to place and time. Recent and remote memory intact. Attention span, concentration and fund of knowledge appropriate. Mood and affect appropriate.  Cranial Nerves: Fundoscopic exam not done. Pupils equal, briskly reactive to light. Extraocular movements full without nystagmus. Visual fields full to confrontation. Hearing intact. Facial sensation intact. Face, tongue, palate moves normally and symmetrically.  Motor: Normal bulk and tone. Normal strength in all tested extremity muscles Sensory.: intact to touch , pinprick , position and vibratory  sensation.  Coordination: Rapid alternating movements normal in all extremities. Finger-to-nose and heel-to-shin performed accurately bilaterally. Gait and Station: Arises from chair without difficulty. Stance is normal. Gait demonstrates normal stride length and balance without use of AD. Tandem walk and heel toe without difficulty.  Reflexes: 1+ and symmetric. Toes downgoing.         ASSESSMENT: Veer Elamin is a 46 y.o. 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 Vascular risk factors include HLD and history of DVT.  Negative hypercoagulable panel labs except for heterozygote state for prothrombin gene mutation     PLAN:  I had a long discussion with the patient and his wife regarding cerebral venous thrombosis prophylaxis.  Likely related to heterozygote state for prothrombin.  He probably will need to be anticoagulation with Eliquis.07/20/2021  He also has a follow-up with hematologist Shane Martin Kitchen who recommends lifelong anticoagulation I encouraged him to maintain adequate.hydration and exercising regularly and.  His prior lifestyle.  Check fasting lipid profile.  Follow-up in the future in 6 months with my nurse practitioner or call earlier if necessary.  I spent 40 minutes of face-to-face and non-face-to-face time with patient and wife.with extensive discussion about his cerebral venous sinus heterozygote.  Prothrombin gene mutation .this included previsit chart review including review of recent hospitalization, lab review, study review, order entry, electronic health record documentation, patient and wife education and discussion regarding recent CVST including etiology, mild residual headaches and continued precautions, use of above medications, other concerns as noted above and answered all other questions to patient and wife's satisfaction   Jeanie Sewer, MD [facility] [address] [place], [place]  Phone [phone] Fax [phone] Note: This document was prepared with digital dictation and possible smart phrase technology. Any transcriptional errors that result from this process are unintentional.

## 2021-11-24 NOTE — Patient Instructions (Addendum)
I had a long discussion with the patient and his wife regarding cerebral venous thrombosis prophylaxis.  Likely related to heterozygote state for prothrombin.  He probably will need to be anticoagulation with Eliquis.Marland Kitchen  He also has a follow-up with hematologist Dr. Jeanie Sewer who recommends lifelong anticoagulation I encouraged him to maintain adequate.hydration and exercising regularly and.  His prior lifestyle.  Check fasting lipid profile.  Follow-up in the future in 6 months with my nurse practitioner or call earlier if necessary. ?  ?

## 2021-11-25 ENCOUNTER — Other Ambulatory Visit: Payer: Self-pay | Admitting: *Deleted

## 2021-11-25 DIAGNOSIS — Z79899 Other long term (current) drug therapy: Secondary | ICD-10-CM

## 2021-11-25 DIAGNOSIS — E785 Hyperlipidemia, unspecified: Secondary | ICD-10-CM

## 2021-11-25 DIAGNOSIS — R799 Abnormal finding of blood chemistry, unspecified: Secondary | ICD-10-CM

## 2021-11-25 NOTE — Progress Notes (Signed)
lip

## 2021-11-29 ENCOUNTER — Other Ambulatory Visit (INDEPENDENT_AMBULATORY_CARE_PROVIDER_SITE_OTHER): Payer: Self-pay

## 2021-11-29 DIAGNOSIS — Z79899 Other long term (current) drug therapy: Secondary | ICD-10-CM

## 2021-11-29 DIAGNOSIS — E785 Hyperlipidemia, unspecified: Secondary | ICD-10-CM | POA: Diagnosis not present

## 2021-11-29 DIAGNOSIS — R799 Abnormal finding of blood chemistry, unspecified: Secondary | ICD-10-CM

## 2021-11-29 DIAGNOSIS — Z0289 Encounter for other administrative examinations: Secondary | ICD-10-CM

## 2021-11-30 LAB — COMPREHENSIVE METABOLIC PANEL
ALT: 58 IU/L — ABNORMAL HIGH (ref 0–44)
AST: 31 IU/L (ref 0–40)
Albumin/Globulin Ratio: 2.1 (ref 1.2–2.2)
Albumin: 4.6 g/dL (ref 4.0–5.0)
Alkaline Phosphatase: 78 IU/L (ref 44–121)
BUN/Creatinine Ratio: 23 — ABNORMAL HIGH (ref 9–20)
BUN: 23 mg/dL (ref 6–24)
Bilirubin Total: 0.6 mg/dL (ref 0.0–1.2)
CO2: 24 mmol/L (ref 20–29)
Calcium: 9.5 mg/dL (ref 8.7–10.2)
Chloride: 102 mmol/L (ref 96–106)
Creatinine, Ser: 0.98 mg/dL (ref 0.76–1.27)
Globulin, Total: 2.2 g/dL (ref 1.5–4.5)
Glucose: 107 mg/dL — ABNORMAL HIGH (ref 70–99)
Potassium: 4.7 mmol/L (ref 3.5–5.2)
Sodium: 140 mmol/L (ref 134–144)
Total Protein: 6.8 g/dL (ref 6.0–8.5)
eGFR: 98 mL/min/{1.73_m2} (ref >59)

## 2021-11-30 LAB — LIPID PANEL
Chol/HDL Ratio: 2.9 ratio (ref 0.0–5.0)
Cholesterol, Total: 155 mg/dL (ref 100–199)
HDL: 54 mg/dL (ref >39)
LDL Chol Calc (NIH): 80 mg/dL (ref 0–99)
Triglycerides: 115 mg/dL (ref 0–149)
VLDL Cholesterol Cal: 21 mg/dL (ref 5–40)

## 2021-12-01 ENCOUNTER — Encounter: Payer: Self-pay | Admitting: Neurology

## 2021-12-06 ENCOUNTER — Ambulatory Visit: Payer: BC Managed Care – PPO | Admitting: Neurology

## 2021-12-22 ENCOUNTER — Other Ambulatory Visit: Payer: Self-pay | Admitting: Family Medicine

## 2021-12-22 DIAGNOSIS — R7401 Elevation of levels of liver transaminase levels: Secondary | ICD-10-CM

## 2021-12-27 ENCOUNTER — Ambulatory Visit
Admission: RE | Admit: 2021-12-27 | Discharge: 2021-12-27 | Disposition: A | Discharge status: Home / Self Care | Payer: BC Managed Care – PPO | Source: Ambulatory Visit | Attending: Family Medicine | Admitting: Family Medicine

## 2021-12-27 DIAGNOSIS — R748 Abnormal levels of other serum enzymes: Secondary | ICD-10-CM | POA: Diagnosis not present

## 2021-12-27 DIAGNOSIS — R7401 Elevation of levels of liver transaminase levels: Secondary | ICD-10-CM

## 2022-01-06 ENCOUNTER — Other Ambulatory Visit (HOSPITAL_BASED_OUTPATIENT_CLINIC_OR_DEPARTMENT_OTHER): Payer: Self-pay | Admitting: Family Medicine

## 2022-01-06 ENCOUNTER — Ambulatory Visit (HOSPITAL_BASED_OUTPATIENT_CLINIC_OR_DEPARTMENT_OTHER)
Admission: RE | Admit: 2022-01-06 | Discharge: 2022-01-06 | Disposition: A | Discharge status: Home / Self Care | Payer: BC Managed Care – PPO | Source: Ambulatory Visit | Attending: Family Medicine | Admitting: Family Medicine

## 2022-01-06 DIAGNOSIS — M79631 Pain in right forearm: Secondary | ICD-10-CM

## 2022-01-06 DIAGNOSIS — R600 Localized edema: Secondary | ICD-10-CM | POA: Diagnosis not present

## 2022-01-06 DIAGNOSIS — Z86718 Personal history of other venous thrombosis and embolism: Secondary | ICD-10-CM | POA: Diagnosis not present

## 2022-01-06 DIAGNOSIS — K760 Fatty (change of) liver, not elsewhere classified: Secondary | ICD-10-CM | POA: Diagnosis not present

## 2022-01-12 ENCOUNTER — Telehealth: Payer: Self-pay | Admitting: Hematology and Oncology

## 2022-01-12 NOTE — Telephone Encounter (Signed)
Called patient regarding upcoming appointments, patient is notified. 

## 2022-02-03 ENCOUNTER — Inpatient Hospital Stay (HOSPITAL_BASED_OUTPATIENT_CLINIC_OR_DEPARTMENT_OTHER): Payer: BC Managed Care – PPO | Admitting: Hematology and Oncology

## 2022-02-03 ENCOUNTER — Other Ambulatory Visit: Payer: Self-pay

## 2022-02-03 ENCOUNTER — Inpatient Hospital Stay: Payer: BC Managed Care – PPO | Attending: Hematology and Oncology

## 2022-02-03 ENCOUNTER — Other Ambulatory Visit: Payer: Self-pay | Admitting: Hematology and Oncology

## 2022-02-03 VITALS — BP 112/73 | HR 62 | Temp 98.2°F | Wt 200.2 lb

## 2022-02-03 DIAGNOSIS — G08 Intracranial and intraspinal phlebitis and thrombophlebitis: Secondary | ICD-10-CM

## 2022-02-03 DIAGNOSIS — D6852 Prothrombin gene mutation: Secondary | ICD-10-CM | POA: Diagnosis not present

## 2022-02-03 DIAGNOSIS — Z79899 Other long term (current) drug therapy: Secondary | ICD-10-CM | POA: Insufficient documentation

## 2022-02-03 DIAGNOSIS — Z86718 Personal history of other venous thrombosis and embolism: Secondary | ICD-10-CM

## 2022-02-03 DIAGNOSIS — Z7901 Long term (current) use of anticoagulants: Secondary | ICD-10-CM | POA: Diagnosis not present

## 2022-02-03 DIAGNOSIS — I82531 Chronic embolism and thrombosis of right popliteal vein: Secondary | ICD-10-CM | POA: Diagnosis not present

## 2022-02-03 LAB — CMP (CANCER CENTER ONLY)
ALT: 52 U/L — ABNORMAL HIGH (ref 0–44)
AST: 29 U/L (ref 15–41)
Albumin: 4.4 g/dL (ref 3.5–5.0)
Alkaline Phosphatase: 77 U/L (ref 38–126)
Anion gap: 6 (ref 5–15)
BUN: 16 mg/dL (ref 6–20)
CO2: 28 mmol/L (ref 22–32)
Calcium: 9.1 mg/dL (ref 8.9–10.3)
Chloride: 106 mmol/L (ref 98–111)
Creatinine: 0.96 mg/dL (ref 0.61–1.24)
GFR, Estimated: >60 mL/min (ref >60)
Glucose, Bld: 95 mg/dL (ref 70–99)
Potassium: 4.2 mmol/L (ref 3.5–5.1)
Sodium: 140 mmol/L (ref 135–145)
Total Bilirubin: 0.8 mg/dL (ref 0.3–1.2)
Total Protein: 7.3 g/dL (ref 6.5–8.1)

## 2022-02-03 LAB — CBC WITH DIFFERENTIAL (CANCER CENTER ONLY)
Abs Immature Granulocytes: 0.04 10*3/uL (ref 0.00–0.07)
Basophils Absolute: 0 10*3/uL (ref 0.0–0.1)
Basophils Relative: 0 %
Eosinophils Absolute: 0.1 10*3/uL (ref 0.0–0.5)
Eosinophils Relative: 1 %
HCT: 44.2 % (ref 39.0–52.0)
Hemoglobin: 15.8 g/dL (ref 13.0–17.0)
Immature Granulocytes: 0 %
Lymphocytes Relative: 19 %
Lymphs Abs: 1.9 10*3/uL (ref 0.7–4.0)
MCH: 29.8 pg (ref 26.0–34.0)
MCHC: 35.7 g/dL (ref 30.0–36.0)
MCV: 83.2 fL (ref 80.0–100.0)
Monocytes Absolute: 0.9 10*3/uL (ref 0.1–1.0)
Monocytes Relative: 9 %
Neutro Abs: 6.8 10*3/uL (ref 1.7–7.7)
Neutrophils Relative %: 71 %
Platelet Count: 220 10*3/uL (ref 150–400)
RBC: 5.31 MIL/uL (ref 4.22–5.81)
RDW: 13.5 % (ref 11.5–15.5)
WBC Count: 9.7 10*3/uL (ref 4.0–10.5)
nRBC: 0 % (ref 0.0–0.2)

## 2022-02-03 NOTE — Progress Notes (Signed)
[facility] [phone]   [phone]  PROGRESS NOTE  Patient Care Team: Darrow Bussing, MD as PCP - General (Family Medicine) Lewayne Bunting, MD as PCP - Cardiology (Cardiology)  Hematological/Oncological History # Dural Venous Sinus Thrombosis # Prior History of VTE 2017: Provoked RLE DVT after ankle fracture 08/23/2019: [place] lower extremity showed nonocclusive chronic DVT/wall thickening involving the distal right femoral vein and right popliteal veins 07/18/2021: CT Head WO contrast showed asymmetric hyperdensity of the right transverse and sigmoid dural venous sinuses, highly suspicious for dural venous sinus thrombosis. Extensive thrombosis of right transverse sinus confirmed by MRI brain and MR venogram of the head. Started on eliquis therapy.  08/05/2021: establish care with Dr. Leonides Schanz.  Recommend indefinite anticoagulation therapy.   Interval History:  Shane Martin 46 y.o. 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 The patient's last visit was on 08/05/2021. In the interim since the last visit Shane Martin has continued on his Eliquis therapy without difficulty.  On exam today Shane Martin is accompanied by his wife.  He reports that all of his head pain is gone.  He reports over the last 6 months there is been a marked improvement in headache and pressure.  He reports that he is somewhat concerned about the report of fatty liver.  He notes that there have had elevated LFTs before in the past and he is done his best to stop alcohol and increase diet and exercise.  He reports he has been intentionally losing weight and is down to 200 pounds from 207 pounds on 11/24/2021.  On further discussion he reports that he is tolerating his Eliquis well without any bleeding or bruising.  He reports he does have some occasional bouts of vertigo and dizziness but that these are  typically rare.  MEDICAL HISTORY:  Past Medical History:  Diagnosis Date   Anxiety    Asthma    Dural venous sinus thrombosis    DVT (deep venous thrombosis) (HCC)    Elevated LFTs    Prothrombin gene mutation (HCC)    Vertigo     SURGICAL HISTORY: Past Surgical History:  Procedure Laterality Date   No prior surgery      SOCIAL HISTORY: Social History   Socioeconomic History   Marital status: Married    Spouse name: Not on file   Number of children: Not on file   Years of education: Not on file   Highest education level: Not on file  Occupational History   Occupation: Field seismologist    Comment: [employer]  Tobacco Use   Smoking status: Never    Passive exposure: Never   Smokeless tobacco: Never  Vaping Use   Vaping Use: Never used  Substance and Sexual Activity   Alcohol use: No   Drug use: No   Sexual activity: Not on file  Other Topics Concern   Not on file  Social History Narrative   Not on file   Social Determinants of Health   Financial Resource Strain: Not on file  Food Insecurity: Not on file  Transportation Needs: Not on file  Physical Activity: Not on file  Stress: Not on file  Social Connections: Not on file  Intimate Partner Violence: Not on file    FAMILY HISTORY: Family History  Problem Relation Age of Onset   CAD Father 19    ALLERGIES:  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  MEDICATIONS:  Current Outpatient Medications  Medication Sig Dispense Refill   albuterol (VENTOLIN HFA) 108 (90 Base) MCG/ACT inhaler Inhale 2 puffs into the lungs every 6 (six) hours as needed (excercise induced asthma).     apixaban (ELIQUIS) 5 MG TABS tablet Take 1 tablet (5 mg total) by mouth 2 (two) times daily. 180 tablet 1   atorvastatin (LIPITOR) 20 MG tablet Take 1 tablet (20 mg total) by mouth daily. 30 tablet 5   No current facility-administered medications for this visit.    REVIEW OF SYSTEMS:   Constitutional: ( - ) fevers, ( - )   chills , ( - ) night sweats Eyes: ( - ) blurriness of vision, ( - ) double vision, ( - ) watery eyes Ears, nose, mouth, throat, and face: ( - ) mucositis, ( - ) sore throat Respiratory: ( - ) cough, ( - ) dyspnea, ( - ) wheezes Cardiovascular: ( - ) palpitation, ( - ) chest discomfort, ( - ) lower extremity swelling Gastrointestinal:  ( - ) nausea, ( - ) heartburn, ( - ) change in bowel habits Skin: ( - ) abnormal skin rashes Lymphatics: ( - ) new lymphadenopathy, ( - ) easy bruising Neurological: ( - ) numbness, ( - ) tingling, ( - ) new weaknesses Behavioral/Psych: ( - ) mood change, ( - ) new changes  All other systems were reviewed with the patient and are negative.  PHYSICAL EXAMINATION:  Vitals:   02/03/22 1357  BP: 112/73  Pulse: 62  Temp: 98.2 F (36.8 C)  SpO2: 98%   Filed Weights   02/03/22 1357  Weight: 200 lb 3.2 oz (90.8 kg)    GENERAL: well appearing middle aged Caucasian male, alert, no distress and comfortable SKIN: skin color, texture, turgor are normal, no rashes or significant lesions EYES: conjunctiva are pink and non-injected, sclera clear LUNGS: clear to auscultation and percussion with normal breathing effort HEART: regular rate & rhythm and no murmurs and no lower extremity edema Musculoskeletal: no cyanosis of digits and no clubbing  PSYCH: alert & oriented x 3, fluent speech NEURO: no focal motor/sensory deficits  LABORATORY DATA:  I have reviewed the data as listed    Latest Ref Rng & Units 02/03/2022    1:31 PM 07/20/2021    3:20 AM 07/19/2021    2:52 PM  CBC  WBC 4.0 - 10.5 K/uL 9.7   10.0   11.7    Hemoglobin 13.0 - 17.0 g/dL 09.3   26.7   12.4    Hematocrit 39.0 - 52.0 % 44.2   44.3   45.6    Platelets 150 - 400 K/uL 220   224   269         Latest Ref Rng & Units 02/03/2022    1:31 PM 11/29/2021    8:15 AM 07/18/2021   10:28 AM  CMP  Glucose 70 - 99 mg/dL 95   580   998    BUN 6 - 20 mg/dL 16   23   14     Creatinine 0.61 - 1.24  mg/dL   3.38   2.50    Sodium 135 - 145 mmol/L 140   140   139    Potassium 3.5 - 5.1 mmol/L 4.2   4.7   3.9    Chloride 98 - 111 mmol/L 106   102   100    CO2 22 - 32 mmol/L 28   24   29  Calcium 8.9 - 10.3 mg/dL 9.1   9.5   9.2    Total Protein 6.5 - 8.1 g/dL 7.3   6.8   8.0    Total Bilirubin 0.3 - 1.2 mg/dL 0.8   0.6   0.8    Alkaline Phos 38 - 126 U/L 77   78   62    AST 15 - 41 U/L 29   31   27     ALT 0 - 44 U/L 52   58   45      RADIOGRAPHIC STUDIES: No results found.  ASSESSMENT & PLAN Tammi KlippelScott Pasch 46 y.o. male with medical history significant for prior DVT in 2017 who presents for evaluation of a dural venous sinus thrombosis.  After review of the labs, review of the records, and discussion with the patient the patients findings are most consistent with a provoked right lower extremity DVT and a dural venous sinus thrombosis.  Given his history of easy clotting I would recommend indefinite anticoagulation.  Typically the recommendation would be for limited duration of therapy for dural venous sinus thrombosis, but given his prior history of VTE I would recommend lifelong duration of therapy.  It appears he has quite prone to clotting.  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  Mr. Tonny BollmanHanson and his wife voiced understanding of this plan moving forward.  We will continue with Eliquis 5 mg twice daily p.o. until the 6632-month mark where we have the option to drop down to maintenance dosing.  # Dural Venous Sinus Thrombosis # Prior History of VTE -- Agree with continued Eliquis 5 mg p.o. twice daily.   -- Patient would like time to consider transition to 2.5 mg twice daily maintenance dosing  --Given his prior episode of VTE and this new unusual thrombosis strongly recommend indefinite anticoagulation --prior hypercoagulable work-up included antiphospholipid  antibody syndrome, protein C, protein S deficiencies, as well as heritable coagulopathies. He was noted to be heterozygous for prothrombin gene mutation. We discussed the modest increase risk in thrombosis with this condition.  --We will plan to see the patient back in approximately 6 months time.  No orders of the defined types were placed in this encounter.  All questions were answered. The patient knows to call the clinic with any problems, questions or concerns.  A total of more than 30 minutes were spent on this encounter with face-to-face time and non-face-to-face time, including preparing to see the patient, ordering tests and/or medications, counseling the patient and coordination of care as outlined above.   Ulysees BarnsJohn T. Maddex Garlitz, MD Department of Hematology/Oncology [facility] Phone: [phone] Pager: [phone] Email: [email or web address]  02/13/2022 5:23 PM

## 2022-02-15 ENCOUNTER — Other Ambulatory Visit: Payer: Self-pay

## 2022-02-15 DIAGNOSIS — E785 Hyperlipidemia, unspecified: Secondary | ICD-10-CM

## 2022-02-15 MED ORDER — ATORVASTATIN CALCIUM 20 MG PO TABS: 20.0000 mg | ORAL_TABLET | Freq: Every day | ORAL | 5 refills | Status: DC

## 2022-02-15 NOTE — Progress Notes (Signed)
Rx refilled.

## 2022-02-23 ENCOUNTER — Ambulatory Visit: Payer: BC Managed Care – PPO | Admitting: Adult Health

## 2022-02-25 DIAGNOSIS — M25521 Pain in right elbow: Secondary | ICD-10-CM | POA: Diagnosis not present

## 2022-03-11 ENCOUNTER — Other Ambulatory Visit: Payer: Self-pay | Admitting: Hematology and Oncology

## 2022-04-05 DIAGNOSIS — K760 Fatty (change of) liver, not elsewhere classified: Secondary | ICD-10-CM | POA: Diagnosis not present

## 2022-04-05 DIAGNOSIS — Z86718 Personal history of other venous thrombosis and embolism: Secondary | ICD-10-CM | POA: Diagnosis not present

## 2022-04-12 ENCOUNTER — Telehealth: Payer: Self-pay | Admitting: Neurology

## 2022-04-12 NOTE — Telephone Encounter (Signed)
Pt would like a call from the nurse discuss liver elevated enzymes and if related to taking atorvastatin (LIPITOR) 20 MG tablet.

## 2022-04-12 NOTE — Telephone Encounter (Signed)
Recent CMPs have shown elevated ALT.  Appears that patient started lipitor in December of 2022.  Will send to Dr. Pearlean Brownie for further consideration.

## 2022-04-12 NOTE — Telephone Encounter (Signed)
Per Dr. Pearlean Brownie: "Kindly inform the patient that his lab work suggests marginally elevated one of the liver enzymes since December.  If this was present before starting Lipitor it is unrelated and if it started after taking Lipitor it needs to be followed normally but do not worry unless enzymes are 2-3 times normal range.  If on follow-up the enzymes levels continue worsening may need to consider stopping Lipitor and trying alternate medications."  I called patient. He had recent labs at his PCP which are not in Epic.  AST:75 ALT:71  He has a history of elevated liver enzymes and a recent liver US revealed possible fatty liver disease.  I advised him that I would inform Dr. Pearlean Brownie of this information and call him back if the recommendations change. Pt verbalized understanding.

## 2022-04-26 ENCOUNTER — Other Ambulatory Visit: Payer: Self-pay | Admitting: Hematology and Oncology

## 2022-04-26 ENCOUNTER — Inpatient Hospital Stay (HOSPITAL_BASED_OUTPATIENT_CLINIC_OR_DEPARTMENT_OTHER): Payer: BC Managed Care – PPO | Admitting: Hematology and Oncology

## 2022-04-26 ENCOUNTER — Encounter: Payer: Self-pay | Admitting: *Deleted

## 2022-04-26 ENCOUNTER — Inpatient Hospital Stay: Payer: BC Managed Care – PPO | Attending: Hematology and Oncology

## 2022-04-26 VITALS — BP 121/79 | HR 57 | Temp 98.0°F | Resp 18 | Wt 192.5 lb

## 2022-04-26 DIAGNOSIS — G08 Intracranial and intraspinal phlebitis and thrombophlebitis: Secondary | ICD-10-CM

## 2022-04-26 DIAGNOSIS — D6852 Prothrombin gene mutation: Secondary | ICD-10-CM | POA: Insufficient documentation

## 2022-04-26 DIAGNOSIS — Z7901 Long term (current) use of anticoagulants: Secondary | ICD-10-CM | POA: Insufficient documentation

## 2022-04-26 DIAGNOSIS — Z86718 Personal history of other venous thrombosis and embolism: Secondary | ICD-10-CM | POA: Diagnosis not present

## 2022-04-26 LAB — CBC WITH DIFFERENTIAL (CANCER CENTER ONLY)
Abs Immature Granulocytes: 0.03 10*3/uL (ref 0.00–0.07)
Basophils Absolute: 0 10*3/uL (ref 0.0–0.1)
Basophils Relative: 1 %
Eosinophils Absolute: 0.3 10*3/uL (ref 0.0–0.5)
Eosinophils Relative: 3 %
HCT: 44.7 % (ref 39.0–52.0)
Hemoglobin: 16 g/dL (ref 13.0–17.0)
Immature Granulocytes: 0 %
Lymphocytes Relative: 22 %
Lymphs Abs: 1.7 10*3/uL (ref 0.7–4.0)
MCH: 29.8 pg (ref 26.0–34.0)
MCHC: 35.8 g/dL (ref 30.0–36.0)
MCV: 83.2 fL (ref 80.0–100.0)
Monocytes Absolute: 0.8 10*3/uL (ref 0.1–1.0)
Monocytes Relative: 10 %
Neutro Abs: 5.1 10*3/uL (ref 1.7–7.7)
Neutrophils Relative %: 64 %
Platelet Count: 204 10*3/uL (ref 150–400)
RBC: 5.37 MIL/uL (ref 4.22–5.81)
RDW: 13.5 % (ref 11.5–15.5)
WBC Count: 8 10*3/uL (ref 4.0–10.5)
nRBC: 0 % (ref 0.0–0.2)

## 2022-04-26 LAB — CMP (CANCER CENTER ONLY)
ALT: 42 U/L (ref 0–44)
AST: 28 U/L (ref 15–41)
Albumin: 4.5 g/dL (ref 3.5–5.0)
Alkaline Phosphatase: 71 U/L (ref 38–126)
Anion gap: 4 — ABNORMAL LOW (ref 5–15)
BUN: 18 mg/dL (ref 6–20)
CO2: 31 mmol/L (ref 22–32)
Calcium: 9.1 mg/dL (ref 8.9–10.3)
Chloride: 103 mmol/L (ref 98–111)
Creatinine: 1.01 mg/dL (ref 0.61–1.24)
GFR, Estimated: >60 mL/min (ref >60)
Glucose, Bld: 113 mg/dL — ABNORMAL HIGH (ref 70–99)
Potassium: 4.6 mmol/L (ref 3.5–5.1)
Sodium: 138 mmol/L (ref 135–145)
Total Bilirubin: 1.1 mg/dL (ref 0.3–1.2)
Total Protein: 7.3 g/dL (ref 6.5–8.1)

## 2022-04-26 NOTE — Progress Notes (Signed)
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 Extensive thrombosis of right transverse sinus confirmed by MRI brain and MR venogram of the head. Started on eliquis therapy.  08/05/2021: establish care with Dr. Leonides Martin.  Recommend indefinite anticoagulation therapy. Continue eliquis 5 mg BID   Interval History:  Shane Martin 46 y.o. 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 The patient's last visit was on 02/03/2022. In the interim since the last visit Shane Martin has continued on his Eliquis therapy without difficulty.  On exam today Shane Martin is accompanied by his wife.  He reports he has been well overall interim since her last visit.  He reports he is doing his best to exercise more and is running 3 miles every other day.  He notes that sometimes when he runs he gets some pain in his right shoulder and that makes him nervous because I was a site where he had his dural venous thrombosis.  Reports he has been taking his Eliquis faithfully 5 mg twice daily.  He does not have any bleeding or bruising as a result of this.  He did have an episode yesterday where he was unsure if he took a dose and was nervous about double dosing.  He reports he does have some occasional bumps on his elbows which bruise  mildly.  He is also nervous about the fact that his AST and ALT were in the "70s" at his last visit with his primary care provider.  We discussed that this is likely not due to the Eliquis and would not interfere with Eliquis therapy.  He voices understanding of this.  He otherwise denies any fevers, chills, sweats, nausea, or diarrhea.  He denies any signs or symptoms concerning recurrent VTE such as leg swelling, pain in the leg, shortness of breath, chest pain, or headache.  Full 10 point ROS is listed below.  On a personal note his wife is currently [redacted] weeks pregnant.  MEDICAL HISTORY:  Past Medical History:  Diagnosis Date   Anxiety    Asthma    Dural venous sinus thrombosis    DVT (deep venous thrombosis) (HCC)    Elevated LFTs    Prothrombin gene mutation (HCC)    Vertigo     SURGICAL HISTORY: Past Surgical History:  Procedure Laterality Date   No prior surgery      SOCIAL HISTORY: Social History   Socioeconomic History   Marital status: Married    Spouse name: Not on file   Number of children: Not on file   Years of education: Not on file   Highest education level: Not on file  Occupational History   Occupation: Field seismologist    Comment: [employer]  Tobacco Use   Smoking status: Never    Passive exposure: Never   Smokeless tobacco: Never  Vaping Use   Vaping Use: Never used  Substance and Sexual Activity  Alcohol use: No   Drug use: No   Sexual activity: Not on file  Other Topics Concern   Not on file  Social History Narrative   Not on file   Social Determinants of Health   Financial Resource Strain: Not on file  Food Insecurity: Not on file  Transportation Needs: Not on file  Physical Activity: Not on file  Stress: Not on file  Social Connections: Not on file  Intimate Partner Violence: Not on file    FAMILY HISTORY: Family History  Problem Relation Age of Onset   CAD Father 79    ALLERGIES:  has no active allergies.  MEDICATIONS:   Current Outpatient Medications  Medication Sig Dispense Refill   atorvastatin (LIPITOR) 20 MG tablet Take 1 tablet (20 mg total) by mouth daily. 30 tablet 5   ELIQUIS 5 MG TABS tablet TAKE 1 TABLET(5 MG) BY MOUTH TWICE DAILY 180 tablet 1   albuterol (VENTOLIN HFA) 108 (90 Base) MCG/ACT inhaler Inhale 2 puffs into the lungs every 6 (six) hours as needed (excercise induced asthma). (Patient not taking: Reported on 04/26/2022)     No current facility-administered medications for this visit.    REVIEW OF SYSTEMS:   Constitutional: ( - ) fevers, ( - )  chills , ( - ) night sweats Eyes: ( - ) blurriness of vision, ( - ) double vision, ( - ) watery eyes Ears, nose, mouth, throat, and face: ( - ) mucositis, ( - ) sore throat Respiratory: ( - ) cough, ( - ) dyspnea, ( - ) wheezes Cardiovascular: ( - ) palpitation, ( - ) chest discomfort, ( - ) lower extremity swelling Gastrointestinal:  ( - ) nausea, ( - ) heartburn, ( - ) change in bowel habits Skin: ( - ) abnormal skin rashes Lymphatics: ( - ) new lymphadenopathy, ( - ) easy bruising Neurological: ( - ) numbness, ( - ) tingling, ( - ) new weaknesses Behavioral/Psych: ( - ) mood change, ( - ) new changes  All other systems were reviewed with the patient and are negative.  PHYSICAL EXAMINATION:  Vitals:   04/26/22 0904  BP: 121/79  Pulse: (!) 57  Resp: 18  Temp: 98 F (36.7 C)  SpO2: 100%   Filed Weights   04/26/22 0904  Weight: 192 lb 8 oz (87.3 kg)    GENERAL: well appearing middle aged Caucasian male, alert, no distress and comfortable SKIN: skin color, texture, turgor are normal, no rashes or significant lesions EYES: conjunctiva are pink and non-injected, sclera clear LUNGS: clear to auscultation and percussion with normal breathing effort HEART: regular rate & rhythm and no murmurs and no lower extremity edema Musculoskeletal: no cyanosis of digits and no clubbing  PSYCH: alert & oriented x 3, fluent speech NEURO: no focal  motor/sensory deficits  LABORATORY DATA:  I have reviewed the data as listed    Latest Ref Rng & Units 04/26/2022    8:34 AM 02/03/2022    1:31 PM 07/20/2021    3:20 AM  CBC  WBC 4.0 - 10.5 K/uL 8.0  9.7  10.0   Hemoglobin 13.0 - 17.0 g/dL 31.5  17.6  16.0   Hematocrit 39.0 - 52.0 % 44.7  44.2  44.3   Platelets 150 - 400 K/uL 204  220  224        Latest Ref Rng & Units 04/26/2022    8:34 AM 02/03/2022    1:31 PM 11/29/2021    8:15 AM  CMP  Glucose 70 - 99 mg/dL 094  95  709   BUN 6 - 20 mg/dL 18  16  23    Creatinine 0.61 - 1.24 mg/dL  6.28  3.66   Sodium 135 - 145 mmol/L 138  140  140   Potassium 3.5 - 5.1 mmol/L 4.6  4.2  4.7   Chloride 98 - 111 mmol/L 103  106  102   CO2 22 - 32 mmol/L 31  28  24    Calcium 8.9 - 10.3 mg/dL 9.1  9.1  9.5   Total Protein 6.5 - 8.1 g/dL 7.3  7.3  6.8   Total Bilirubin 0.3 - 1.2 mg/dL 1.1  0.8  0.6   Alkaline Phos 38 - 126 U/L 71  77  78   AST 15 - 41 U/L 28  29  31    ALT 0 - 44 U/L 42  52  58     RADIOGRAPHIC STUDIES: No results found.  ASSESSMENT & PLAN Shane Martin 46 y.o. male with medical history significant for prior DVT in 2017 who presents for evaluation of a dural venous sinus thrombosis.  After review of the labs, review of the records, and discussion with the patient the patients findings are most consistent with a provoked right lower extremity DVT and a dural venous sinus thrombosis.  Given his history of easy clotting I would recommend indefinite anticoagulation.  Typically the recommendation would be for limited duration of therapy for dural venous sinus thrombosis, but given his prior history of VTE I would recommend lifelong duration of therapy.  It appears he has quite prone to clotting.  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  Shane Martin and his wife voiced understanding of this plan moving forward.  We  will continue with Eliquis 5 mg twice daily p.o. until the 70-month mark where we have the option to drop down to maintenance dosing.  # Dural Venous Sinus Thrombosis # Prior History of VTE -- Agree with continued Eliquis 5 mg p.o. twice daily.   -- Patient does not wish to transition to maintenance of Eliquis 2.5 mg twice daily.  This was discussed today and previously --Given his prior episode of VTE and this new unusual thrombosis strongly recommend indefinite anticoagulation --prior hypercoagulable work-up included antiphospholipid antibody syndrome, protein C, protein S deficiencies, as well as heritable coagulopathies. He was noted to be heterozygous for prothrombin gene mutation. We discussed the modest increase risk in thrombosis with this condition.  --Labs today show white blood cell count 8.0, hemoglobin 16.0, MCV 80.2, and platelets of 204. --We will plan to see the patient back in approximately 6 months time.  No orders of the defined types were placed in this encounter.  All questions were answered. The patient knows to call the clinic with any problems, questions or concerns.  A total of more than 30 minutes were spent on this encounter with face-to-face time and non-face-to-face time, including preparing to see the patient, ordering tests and/or medications, counseling the patient and coordination of care as outlined above.   2018, MD Department of Hematology/Oncology [facility] Phone: [phone] Pager: [phone] Email: [email or web address]  04/26/2022 10:25 AM

## 2022-05-13 ENCOUNTER — Ambulatory Visit: Payer: BC Managed Care – PPO | Admitting: Podiatry

## 2022-05-15 ENCOUNTER — Other Ambulatory Visit: Payer: Self-pay | Admitting: Neurology

## 2022-05-15 DIAGNOSIS — E785 Hyperlipidemia, unspecified: Secondary | ICD-10-CM

## 2022-05-20 ENCOUNTER — Ambulatory Visit (INDEPENDENT_AMBULATORY_CARE_PROVIDER_SITE_OTHER): Payer: BC Managed Care – PPO | Admitting: Podiatry

## 2022-05-20 ENCOUNTER — Encounter: Payer: Self-pay | Admitting: Podiatry

## 2022-05-20 DIAGNOSIS — B351 Tinea unguium: Secondary | ICD-10-CM

## 2022-05-20 DIAGNOSIS — L309 Dermatitis, unspecified: Secondary | ICD-10-CM

## 2022-05-20 NOTE — Progress Notes (Signed)
Subjective:   Patient ID: Shane Martin, male   DOB: 46 y.o.   MRN: [id number]   HPI Patient presents stating he has dry cracked heels he has nail disease of his big toenail and third toenail right and he does have a blood clotting issue and possible low-grade fatty liver condition.  Patient does not smoke likes to be active   Review of Systems  All other systems reviewed and are negative.       Objective:  Physical Exam Vitals and nursing note reviewed.  Constitutional:      Appearance: He is well-developed.  Pulmonary:     Effort: Pulmonary effort is normal.  Musculoskeletal:        General: Normal range of motion.  Skin:    General: Skin is warm.  Neurological:     Mental Status: He is alert.     Neurovascular status intact muscle strength adequate range of motion within normal limits.  Patient has dry heel condition bilateral and on the plantar aspect of the hallux and also has nail disease right hallux right third nailbed with patient having history of DVT and a clotting disease and is on Eliquis along with possible fatty liver.  Good digital perfusion well-oriented     Assessment:  Several different conditions with probable low-grade fungal infection of the nailbeds and natural thickness of the skin with dermatitis-like condition     Plan:  8 NP reviewed both conditions discussing them separately.  For the nails or any use topical medicine and working to use laser therapy and he is scheduled for laser and for the other we will use Vaseline under occlusion and miracle cream.  Patient will be seen back to recheck as needed and hopefully this will help his condition.  Do not recommend oral medicine currently

## 2022-05-30 NOTE — Progress Notes (Unsigned)
[facility] [address] [place]. [place] [address] [phone]       OFFICE FOLLOW UP NOTE  [name] Date of Birth:  Apr 19, 1976 Medical Record Number:  [id number]    Primary neurologist: Dr. Pearlean Martin Reason for Referral:  stroke follow up    SUBJECTIVE:   CHIEF COMPLAINT:  No chief complaint on file.   HPI:   Update 05/31/2022 JM: Patient returns for 50-month follow-up.  He has been stable since prior visit.  He has remained on Eliquis 5 mg twice daily, denies side effects.  Routinely follows with hematologist/oncologist Dr. Leonides Martin.      History provided for reference purposes only Update 11/24/2021 Dr. Pearlean Martin: He returns for follow-up after last visit 3 months ago.  He is accompanied by his wife.  Continues to do well and recurrent neurovascular symptoms.  Did have mild posterior headache for a month or 2 after episode of cerebral venous sinus in October 20 improved.  Occasionally gets a minor headache described as a sensation of pressure while bending down but it  does not stay long and has not needed any medicines.  He had a follow-up MR venogram of the brain done on 10/27/2021 which showed trace occult flow in the transverse sinus persistent fluid in the right sigmoid and jugular vein.  Patient is extremely anxious about going back to his physical activity, work schedule and planning to start a family soon.  He has no other complaints.  He does follow-up with hematologist Dr. Leonides Martin who has told him that he likely needs lifelong anticoagulation given his previous history of provoked DVT following ankle surgery as well as now cerebral venous thrombosis episode last year heterozygote patient prothrombin gene mutation.   Initial visit 08/24/2021 JM: Patient being seen for initial hospital follow-up accompanied by his wife, Shane Martin.   Overall doing well since discharge and gradually improving.  Does report occasional very slight headache. Doesn't stop him  from working or it not debilitating.  Has experienced a zap type sensation on the right side of his head very slight and only a couple times. Also reports right ear tinnitus and possibly present prior. Also reports occasional "clicking" sensation when he swallows his saliva while laying in bed. He did not notice this prior.  Denies actual difficulty swallowing.  Denies any visual changes, neck discomfort or cognitive changes.  Compliant on Eliquis without side effects Evaluated by hematology who recommended indefinite anticoagulation.  He did not start atorvastatin at discharge as he was unsure if this was really needed.  He is willing to start if needed. Blood pressure today 126/72 Denies any tobacco or EtOH use  He and his wife have multiple appropriate questions regarding activity level and any restrictions. He has been going on some walks but wondering if he can start getting back in to running and weight lifting. Also questions ability to do things around the home such as painting and remodeling (they found out they were pregnant a week and after he was discharged home!!).  No further concerns at this time   Hospitalization 07/18/2021 [name] is a 46 y.o. male with history of anxiety, asthma, DVT, transaminitis, and vertigo who presented to ED on 07/18/2021 with c/o HA x 5 days accompanied by n/v. He described the HA as constant, worsening, achy, sharp at times, and throbbing. He initially thought it was "sinus" and took Ibuprofen with mild improvement. He has not recently had a fever, chills, diarrhea, viral illness, cold, or COVID.  Associated symptoms include neck pain.  Personally reviewed hospitalization pertinent progress notes, lab work and imaging.  Evaluated by Dr. 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  MRI negative.  MRV showed extensive thrombosis of the right transverse sinus through the sigmoid sinus and into the upper right internal jugular sinus.   Hypercoagulable work-up pending at discharge.  LDL 115.  A1c 5.2.  Initially on heparin IV and transitioned to Eliquis 5 mg twice daily.  Prior history of DVT 5 years ago in setting of ankle injury and recently received COVID booster 2 weeks prior.  Advised to follow-up with hematology and neurology after discharge.       PERTINENT IMAGING  Per recent hospitalization CT head asymmetric hyperdensity of the right transverse and sigmoid dural venous sinuses, highly suspicious for dural venous sinus thrombosis.  MRI  normal, no infarct or ICH MRV Extensive thrombosis of the right transverse sinus through the sigmoid sinus and into the upper right internal jugular sinus. LDL 115 HgbA1c 5.2   ROS:   14 system review of systems performed and negative with exception of those listed in HPI  PMH:  Past Medical History:  Diagnosis Date   Anxiety    Asthma    Dural venous sinus thrombosis    DVT (deep venous thrombosis) (HCC)    Elevated LFTs    Prothrombin gene mutation (HCC)    Vertigo     PSH:  Past Surgical History:  Procedure Laterality Date   No prior surgery      Social History:  Social History   Socioeconomic History   Marital status: Married    Spouse name: Not on file   Number of children: Not on file   Years of education: Not on file   Highest education level: Not on file  Occupational History   Occupation: Field seismologist    Comment: [employer]  Tobacco Use   Smoking status: Never    Passive exposure: Never   Smokeless tobacco: Never  Vaping Use   Vaping Use: Never used  Substance and Sexual Activity   Alcohol use: No   Drug use: No   Sexual activity: Not on file  Other Topics Concern   Not on file  Social History Narrative   Not on file   Social Determinants of Health   Financial Resource Strain: Not on file  Food Insecurity: Not on file  Transportation Needs: Not on file  Physical Activity: Not on file  Stress: Not on file  Social Connections:  Not on file  Intimate Partner Violence: Not on file    Family History:  Family History  Problem Relation Age of Onset   CAD Father 75    Medications:   Current Outpatient Medications on File Prior to Visit  Medication Sig Dispense Refill   albuterol (VENTOLIN HFA) 108 (90 Base) MCG/ACT inhaler Inhale 2 puffs into the lungs every 6 (six) hours as needed (excercise induced asthma). (Patient not taking: Reported on 04/26/2022)     atorvastatin (LIPITOR) 20 MG tablet Take 1 tablet (20 mg total) by mouth daily. 30 tablet 5   ELIQUIS 5 MG TABS tablet TAKE 1 TABLET(5 MG) BY MOUTH TWICE DAILY 180 tablet 1   No current facility-administered medications on file prior to visit.    Allergies:   No Active Allergies     OBJECTIVE:  Physical Exam  There were no vitals filed for this visit.  There is no height or weight on file to calculate  BMI. No results found.  General: well developed, well nourished, very pleasant middle-age Caucasian male, seated, in no evident distress.  Seems anxious Head: head normocephalic and atraumatic.   Neck: supple with no carotid or supraclavicular bruits Cardiovascular: regular rate and rhythm, no murmurs Musculoskeletal: no deformity Skin:  no rash/petichiae Vascular:  Normal pulses all extremities   Neurologic Exam Mental Status: Awake and fully alert.  Fluent speech and language.  Oriented to place and time. Recent and remote memory intact. Attention span, concentration and fund of knowledge appropriate. Mood and affect appropriate.  Cranial Nerves: Pupils equal, briskly reactive to light. Extraocular movements full without nystagmus. Visual fields full to confrontation. Hearing intact. Facial sensation intact. Face, tongue, palate moves normally and symmetrically.  Motor: Normal bulk and tone. Normal strength in all tested extremity muscles Sensory.: intact to touch , pinprick , position and vibratory sensation.  Coordination: Rapid alternating  movements normal in all extremities. Finger-to-nose and heel-to-shin performed accurately bilaterally. Gait and Station: Arises from chair without difficulty. Stance is normal. Gait demonstrates normal stride length and balance without use of AD. Tandem walk and heel toe without difficulty.  Reflexes: 1+ and symmetric. Toes downgoing.         ASSESSMENT/PLAN: Krrish Freund is a 46 y.o. year old male with recent right cerebral venous sinus thrombosis on 07/18/2021 likely related to heterozygote state for prothrombin. Vascular risk factors include HLD and history of DVT.  Negative hypercoagulable panel labs except for heterozygote state for prothrombin gene mutation      R CVST Continue Eliquis 5 mg twice daily and atorvastatin 20 mg daily for secondary stroke prevention measures Continue close follow-up with hematology/oncology Dr. Leonides Martin Continue close PCP follow-up for aggressive stroke risk factor management including HLD with LDL goal<70       I spent *** minutes of face-to-face and non-face-to-face time with patient.  This included previsit chart review, lab review, study review, order entry, electronic health record documentation, patient education  Shane Martin, Shane Martin  [facility] [address] [place], [place] [address]  Phone [phone] Fax [phone] Note: This document was prepared with digital dictation and possible smart phrase technology. Any transcriptional errors that result from this process are unintentional.

## 2022-05-31 ENCOUNTER — Ambulatory Visit (INDEPENDENT_AMBULATORY_CARE_PROVIDER_SITE_OTHER): Payer: BC Managed Care – PPO | Admitting: Adult Health

## 2022-05-31 ENCOUNTER — Encounter: Payer: Self-pay | Admitting: Adult Health

## 2022-05-31 VITALS — BP 134/79 | HR 74 | Ht 70.0 in | Wt 194.4 lb

## 2022-05-31 DIAGNOSIS — E785 Hyperlipidemia, unspecified: Secondary | ICD-10-CM | POA: Diagnosis not present

## 2022-05-31 DIAGNOSIS — G08 Intracranial and intraspinal phlebitis and thrombophlebitis: Secondary | ICD-10-CM | POA: Diagnosis not present

## 2022-05-31 DIAGNOSIS — D6859 Other primary thrombophilia: Secondary | ICD-10-CM | POA: Diagnosis not present

## 2022-05-31 DIAGNOSIS — R748 Abnormal levels of other serum enzymes: Secondary | ICD-10-CM | POA: Diagnosis not present

## 2022-05-31 NOTE — Patient Instructions (Addendum)
Continue  Eliquis 5 mg twice daily and atorvastatin 20mg  daily for secondary stroke prevention  We can recheck liver enzymes and cholesterol levels, if LDL or bad cholesterol <70, we can consider decreasing dosage.   Continue to follow up with PCP regarding cholesterol and blood pressure management  Maintain strict control of hypertension with blood pressure goal below 130/90 and cholesterol with LDL cholesterol (bad cholesterol) goal below 70 mg/dL.   Signs of a Stroke? Follow the BEFAST method:  Balance Watch for a sudden loss of balance, trouble with coordination or vertigo Eyes Is there a sudden loss of vision in one or both eyes? Or double vision?  Face: Ask the person to smile. Does one side of the face droop or is it numb?  Arms: Ask the person to raise both arms. Does one arm drift downward? Is there weakness or numbness of a leg? Speech: Ask the person to repeat a simple phrase. Does the speech sound slurred/strange? Is the person confused ? Time: If you observe any of these signs, call 911.       Thank you for coming to see at [facility]. I hope we have been able to provide you high quality care today.  You may receive a patient satisfaction survey over the next few weeks. We would appreciate your feedback and comments so that we may continue to improve ourselves and the health of our patients.

## 2022-06-14 ENCOUNTER — Telehealth: Payer: Self-pay | Admitting: Podiatry

## 2022-06-14 NOTE — Telephone Encounter (Signed)
Pt sent message thru Deloris Ping stating he cxled his laser appt and needed to reschedule. I called pt to r/s and left message for pt to call us to get the appt rescheduled.

## 2022-06-17 ENCOUNTER — Other Ambulatory Visit: Payer: BC Managed Care – PPO

## 2022-06-29 ENCOUNTER — Other Ambulatory Visit (INDEPENDENT_AMBULATORY_CARE_PROVIDER_SITE_OTHER): Payer: Self-pay

## 2022-06-29 DIAGNOSIS — E785 Hyperlipidemia, unspecified: Secondary | ICD-10-CM

## 2022-06-29 DIAGNOSIS — R748 Abnormal levels of other serum enzymes: Secondary | ICD-10-CM | POA: Diagnosis not present

## 2022-06-29 DIAGNOSIS — Z0289 Encounter for other administrative examinations: Secondary | ICD-10-CM

## 2022-06-30 ENCOUNTER — Other Ambulatory Visit: Payer: Self-pay | Admitting: Adult Health

## 2022-06-30 DIAGNOSIS — E785 Hyperlipidemia, unspecified: Secondary | ICD-10-CM

## 2022-06-30 LAB — HEPATIC FUNCTION PANEL
ALT: 65 IU/L — ABNORMAL HIGH (ref 0–44)
AST: 38 IU/L (ref 0–40)
Albumin: 4.5 g/dL (ref 4.1–5.1)
Alkaline Phosphatase: 74 IU/L (ref 44–121)
Bilirubin Total: 0.8 mg/dL (ref 0.0–1.2)
Bilirubin, Direct: 0.24 mg/dL (ref 0.00–0.40)
Total Protein: 6.5 g/dL (ref 6.0–8.5)

## 2022-06-30 LAB — LIPID PANEL
Chol/HDL Ratio: 2.6 ratio (ref 0.0–5.0)
Cholesterol, Total: 127 mg/dL (ref 100–199)
HDL: 48 mg/dL (ref >39)
LDL Chol Calc (NIH): 61 mg/dL (ref 0–99)
Triglycerides: 93 mg/dL (ref 0–149)
VLDL Cholesterol Cal: 18 mg/dL (ref 5–40)

## 2022-06-30 MED ORDER — ATORVASTATIN CALCIUM 10 MG PO TABS: 10.0000 mg | ORAL_TABLET | Freq: Every day | ORAL | 5 refills | Status: DC

## 2022-07-05 DIAGNOSIS — M7711 Lateral epicondylitis, right elbow: Secondary | ICD-10-CM | POA: Diagnosis not present

## 2022-07-05 DIAGNOSIS — M79675 Pain in left toe(s): Secondary | ICD-10-CM | POA: Diagnosis not present

## 2022-07-05 DIAGNOSIS — M79672 Pain in left foot: Secondary | ICD-10-CM | POA: Diagnosis not present

## 2022-07-20 DIAGNOSIS — M2550 Pain in unspecified joint: Secondary | ICD-10-CM | POA: Diagnosis not present

## 2022-07-20 DIAGNOSIS — Z0000 Encounter for general adult medical examination without abnormal findings: Secondary | ICD-10-CM | POA: Diagnosis not present

## 2022-08-03 ENCOUNTER — Other Ambulatory Visit: Payer: Self-pay | Admitting: Hematology and Oncology

## 2022-08-03 ENCOUNTER — Inpatient Hospital Stay: Payer: BC Managed Care – PPO | Attending: Hematology and Oncology

## 2022-08-03 ENCOUNTER — Inpatient Hospital Stay (HOSPITAL_BASED_OUTPATIENT_CLINIC_OR_DEPARTMENT_OTHER): Payer: BC Managed Care – PPO | Admitting: Hematology and Oncology

## 2022-08-03 VITALS — BP 135/80 | HR 63 | Temp 98.3°F | Resp 17 | Wt 198.0 lb

## 2022-08-03 DIAGNOSIS — Z86718 Personal history of other venous thrombosis and embolism: Secondary | ICD-10-CM | POA: Diagnosis not present

## 2022-08-03 DIAGNOSIS — G08 Intracranial and intraspinal phlebitis and thrombophlebitis: Secondary | ICD-10-CM | POA: Insufficient documentation

## 2022-08-03 DIAGNOSIS — Z7901 Long term (current) use of anticoagulants: Secondary | ICD-10-CM

## 2022-08-03 LAB — CBC WITH DIFFERENTIAL (CANCER CENTER ONLY)
Abs Immature Granulocytes: 0.03 10*3/uL (ref 0.00–0.07)
Basophils Absolute: 0 10*3/uL (ref 0.0–0.1)
Basophils Relative: 0 %
Eosinophils Absolute: 0.1 10*3/uL (ref 0.0–0.5)
Eosinophils Relative: 2 %
HCT: 43.3 % (ref 39.0–52.0)
Hemoglobin: 15.3 g/dL (ref 13.0–17.0)
Immature Granulocytes: 0 %
Lymphocytes Relative: 29 %
Lymphs Abs: 2.6 10*3/uL (ref 0.7–4.0)
MCH: 29.8 pg (ref 26.0–34.0)
MCHC: 35.3 g/dL (ref 30.0–36.0)
MCV: 84.2 fL (ref 80.0–100.0)
Monocytes Absolute: 0.7 10*3/uL (ref 0.1–1.0)
Monocytes Relative: 8 %
Neutro Abs: 5.4 10*3/uL (ref 1.7–7.7)
Neutrophils Relative %: 61 %
Platelet Count: 213 10*3/uL (ref 150–400)
RBC: 5.14 MIL/uL (ref 4.22–5.81)
RDW: 13.7 % (ref 11.5–15.5)
WBC Count: 8.9 10*3/uL (ref 4.0–10.5)
nRBC: 0 % (ref 0.0–0.2)

## 2022-08-03 LAB — CMP (CANCER CENTER ONLY)
ALT: 74 U/L — ABNORMAL HIGH (ref 0–44)
AST: 43 U/L — ABNORMAL HIGH (ref 15–41)
Albumin: 4.2 g/dL (ref 3.5–5.0)
Alkaline Phosphatase: 63 U/L (ref 38–126)
Anion gap: 6 (ref 5–15)
BUN: 20 mg/dL (ref 6–20)
CO2: 29 mmol/L (ref 22–32)
Calcium: 9 mg/dL (ref 8.9–10.3)
Chloride: 106 mmol/L (ref 98–111)
Creatinine: 1.09 mg/dL (ref 0.61–1.24)
GFR, Estimated: >60 mL/min (ref >60)
Glucose, Bld: 101 mg/dL — ABNORMAL HIGH (ref 70–99)
Potassium: 4.3 mmol/L (ref 3.5–5.1)
Sodium: 141 mmol/L (ref 135–145)
Total Bilirubin: 1 mg/dL (ref 0.3–1.2)
Total Protein: 6.9 g/dL (ref 6.5–8.1)

## 2022-08-03 NOTE — Progress Notes (Signed)
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 Extensive thrombosis of right transverse sinus confirmed by MRI brain and MR venogram of the head. Started on eliquis therapy.  08/05/2021: establish care with Dr. Leonides Schanz.  Recommend indefinite anticoagulation therapy. Continue eliquis 5 mg BID.   Interval History:  Shane Martin 46 y.o. 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 The patient's last visit was on 04/26/2022. In the interim since the last visit Shane Martin has continued on his Eliquis therapy without difficulty.  On exam today Shane Martin reports that things are going quite well.  He does have a list of questions to discuss.  He notes that he has been tolerating his Eliquis quite well at 5 mg twice daily with no bleeding or bruising.  He reports he does bump his elbows on occasion but does not develop any bruising.  He reports his hands do get red on occasion but did not have any pain or discomfort.  He has had about 2 or 3 dizzy spells with vertigo issues.  He reports that these vertigo issues began shortly after starting Eliquis therapy.  He reports that he does have some occasional ringing of his ears about 2-3 times per day.  He reports that 1 month ago he woke up and had some discomfort on  his left testicle.  He had been lying on his stomach and he felt a heaviness.  He denies any lumps or bumps noted at that time.  He also has some pain in his left foot on occasion.  He spoke with orthopedics who noted that there may have been aggravation of the toe but no evidence of gout.  He otherwise denies any fevers, chills, sweats, nausea, or diarrhea.  He denies any signs or symptoms concerning recurrent VTE such as leg swelling, pain in the leg, shortness of breath, chest pain, or headache.  Full 10 point ROS is listed below.  On a personal note his wife is currently 5 months pregnant.  MEDICAL HISTORY:  Past Medical History:  Diagnosis Date   Anxiety    Asthma    Dural venous sinus thrombosis    DVT (deep venous thrombosis) (HCC)    Elevated LFTs    Prothrombin gene mutation (HCC)    Vertigo     SURGICAL HISTORY: Past Surgical History:  Procedure Laterality Date   No prior surgery      SOCIAL HISTORY: Social History   Socioeconomic History   Marital status: Married    Spouse name: Not on file   Number of children: Not on file   Years of education: Not on file   Highest education level: Not on file  Occupational History   Occupation: Field seismologist    Comment: [employer]  Tobacco Use   Smoking status: Never    Passive exposure: Never   Smokeless tobacco: Never  Vaping Use   Vaping Use: Never  used  Substance and Sexual Activity   Alcohol use: No   Drug use: No   Sexual activity: Not on file  Other Topics Concern   Not on file  Social History Narrative   Not on file   Social Determinants of Health   Financial Resource Strain: Not on file  Food Insecurity: Not on file  Transportation Needs: Not on file  Physical Activity: Not on file  Stress: Not on file  Social Connections: Not on file  Intimate Partner Violence: Not on file    FAMILY HISTORY: Family History  Problem Relation Age of Onset   CAD Father 44    ALLERGIES:  has no active  allergies.  MEDICATIONS:  Current Outpatient Medications  Medication Sig Dispense Refill   albuterol (VENTOLIN HFA) 108 (90 Base) MCG/ACT inhaler Inhale 2 puffs into the lungs every 6 (six) hours as needed (excercise induced asthma).     atorvastatin (LIPITOR) 10 MG tablet Take 1 tablet (10 mg total) by mouth daily. 30 tablet 5   ELIQUIS 5 MG TABS tablet TAKE 1 TABLET(5 MG) BY MOUTH TWICE DAILY 180 tablet 1   No current facility-administered medications for this visit.    REVIEW OF SYSTEMS:   Constitutional: ( - ) fevers, ( - )  chills , ( - ) night sweats Eyes: ( - ) blurriness of vision, ( - ) double vision, ( - ) watery eyes Ears, nose, mouth, throat, and face: ( - ) mucositis, ( - ) sore throat Respiratory: ( - ) cough, ( - ) dyspnea, ( - ) wheezes Cardiovascular: ( - ) palpitation, ( - ) chest discomfort, ( - ) lower extremity swelling Gastrointestinal:  ( - ) nausea, ( - ) heartburn, ( - ) change in bowel habits Skin: ( - ) abnormal skin rashes Lymphatics: ( - ) new lymphadenopathy, ( - ) easy bruising Neurological: ( - ) numbness, ( - ) tingling, ( - ) new weaknesses Behavioral/Psych: ( - ) mood change, ( - ) new changes  All other systems were reviewed with the patient and are negative.  PHYSICAL EXAMINATION:  Vitals:   08/03/22 1409  BP: 135/80  Pulse: 63  Resp: 17  Temp: 98.3 F (36.8 C)  SpO2: 100%   Filed Weights   08/03/22 1409  Weight: 198 lb (89.8 kg)    GENERAL: well appearing middle aged Caucasian male, alert, no distress and comfortable SKIN: skin color, texture, turgor are normal, no rashes or significant lesions EYES: conjunctiva are pink and non-injected, sclera clear LUNGS: clear to auscultation and percussion with normal breathing effort HEART: regular rate & rhythm and no murmurs and no lower extremity edema Musculoskeletal: no cyanosis of digits and no clubbing  PSYCH: alert & oriented x 3, fluent speech NEURO: no focal motor/sensory  deficits  LABORATORY DATA:  I have reviewed the data as listed    Latest Ref Rng & Units 08/03/2022    1:59 PM 04/26/2022    8:34 AM 02/03/2022    1:31 PM  CBC  WBC 4.0 - 10.5 K/uL 8.9  8.0  9.7   Hemoglobin 13.0 - 17.0 g/dL 47.4  25.9  56.3   Hematocrit 39.0 - 52.0 % 43.3  44.7  44.2   Platelets 150 - 400 K/uL 213  204  220        Latest Ref Rng & Units 06/29/2022    8:19 AM 04/26/2022    8:34 AM 02/03/2022    1:31 PM  CMP  Glucose 70 - 99 mg/dL  063  95   BUN 6 - 20 mg/dL  18  16   Creatinine 0.16 - 1.24 mg/dL  0.10  9.32   Sodium 355 - 145 mmol/L  138  140   Potassium 3.5 - 5.1 mmol/L  4.6  4.2   Chloride 98 - 111 mmol/L  103  106   CO2 22 - 32 mmol/L  31  28   Calcium 8.9 - 10.3 mg/dL  9.1  9.1   Total Protein 6.0 - 8.5 g/dL 6.5  7.3  7.3   Total Bilirubin 0.0 - 1.2 mg/dL 0.8  1.1  0.8   Alkaline Phos 44 - 121 IU/L 74  71  77   AST 0 - 40 IU/L 38  28  29   ALT 0 - 44 IU/L 65  42  52     RADIOGRAPHIC STUDIES: No results found.  ASSESSMENT & PLAN Shane Martin 46 y.o. male with medical history significant for prior DVT in 2017 who presents for evaluation of a dural venous sinus thrombosis.  After review of the labs, review of the records, and discussion with the patient the patients findings are most consistent with a provoked right lower extremity DVT and a dural venous sinus thrombosis.  Given his history of easy clotting I would recommend indefinite anticoagulation.  Typically the recommendation would be for limited duration of therapy for dural venous sinus thrombosis, but given his prior history of VTE I would recommend lifelong duration of therapy.  It appears he has quite prone to clotting.  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  Shane Martin and his wife voiced understanding of this plan moving forward.  We will continue with Eliquis 5 mg twice  daily p.o.  # Dural Venous Sinus Thrombosis # Prior History of VTE -- Agree with continued Eliquis 5 mg p.o. twice daily.   -- Patient does not wish to transition to maintenance of Eliquis 2.5 mg twice daily.  This was discussed today and previously --Given his prior episode of VTE and this new unusual thrombosis strongly recommend indefinite anticoagulation --prior hypercoagulable work-up included antiphospholipid antibody syndrome, protein C, protein S deficiencies, as well as heritable coagulopathies. He was noted to be heterozygous for prothrombin gene mutation. We discussed the modest increase risk in thrombosis with this condition.  --Labs today show white blood cell count 8.9, hemoglobin 50.3, MCV 84.2, and platelets of 213 --We will plan to see the patient back in approximately 6 months time.  No orders of the defined types were placed in this encounter.  All questions were answered. The patient knows to call the clinic with any problems, questions or concerns.  A total of more than 30 minutes were spent on this encounter with face-to-face time and non-face-to-face time, including preparing to see the patient, ordering tests and/or medications, counseling the patient and coordination of care as outlined above.   Shane Barns, MD Department of Hematology/Oncology [facility] Phone: [phone] Pager: [phone] Email: [email or web address]  08/03/2022 2:13 PM

## 2022-08-08 ENCOUNTER — Telehealth: Payer: Self-pay | Admitting: Adult Health

## 2022-08-08 MED ORDER — ATORVASTATIN CALCIUM 20 MG PO TABS: 20.0000 mg | ORAL_TABLET | Freq: Every day | ORAL | 0 refills | Status: DC

## 2022-08-08 NOTE — Telephone Encounter (Signed)
Pt said, PCP suggested change dosage back to 20 mg for  atorvastatin (LIPITOR) 10 MG tablet. Pt request to change back because PCP does not believe Lipitor caused enzymes levels to be  high. Would like to be contactedif can pick prescription for today.

## 2022-08-08 NOTE — Telephone Encounter (Signed)
Called the patient back. Pt states that he had a annual visit with his PCP and the PCP was not in favor of reducing the lipitor from 20 mg to 10 mg. The patient states that Shanda Bumps also did not make this recommendation it was only because he was concerned about the lipitor affecting the liver enzymes. He would like to go back to the 20 mg strength and asked for a new script to be sent in for him. I advised I would run this by Shanda Bumps and make sure she is ok with this. Advised if so a 90 day supply will be sent in for him.

## 2022-08-08 NOTE — Telephone Encounter (Signed)
Yes can go back to 20mg  dosage, can be sent to pharmacy by PCP as they will continue to manage ongoing and will f/u with only as needed.

## 2022-08-10 ENCOUNTER — Other Ambulatory Visit: Payer: Self-pay | Admitting: Family Medicine

## 2022-08-10 DIAGNOSIS — N50812 Left testicular pain: Secondary | ICD-10-CM | POA: Diagnosis not present

## 2022-08-25 ENCOUNTER — Other Ambulatory Visit: Payer: BC Managed Care – PPO

## 2022-09-09 ENCOUNTER — Other Ambulatory Visit: Payer: Self-pay | Admitting: Hematology and Oncology

## 2022-09-16 ENCOUNTER — Other Ambulatory Visit: Payer: BC Managed Care – PPO

## 2022-10-05 ENCOUNTER — Telehealth: Payer: Self-pay | Admitting: *Deleted

## 2022-10-05 NOTE — Patient Outreach (Signed)
  Care Coordination   10/05/2022 Name: Shane Martin MRN: [id number] DOB: Sep 19, 1976   Care Coordination Outreach Attempts:  An unsuccessful telephone outreach was attempted today to offer the patient information about available care coordination services as a benefit of their health plan.   Follow Up Plan:  Additional outreach attempts will be made to offer the patient care coordination information and services.   Encounter Outcome:  No Answer   Care Coordination Interventions:  No, not indicated    Raina Mina, RN Care Management Coordinator [place] [facility] [phone]

## 2022-10-06 ENCOUNTER — Telehealth: Payer: Self-pay | Admitting: Adult Health

## 2022-10-06 MED ORDER — MECLIZINE HCL 25 MG PO CHEW: 25.0000 mg | CHEWABLE_TABLET | Freq: Two times a day (BID) | ORAL | 0 refills | Status: DC | PRN

## 2022-10-06 NOTE — Telephone Encounter (Signed)
Chart reviewed,  Patient had a history of cerebral venous thrombosis, in October 2022 presenting with headache Is seen hematologist, on Eliquis,  Last seen by Janett Billow in February 2023, MRV of the brain showed right transverse sinus has small amount of flow signal, likely represents partial recanalization, right sigmoid and internal jugular vein is still not visualized, likely representing residual thrombosis  Slight improvement compared to previous MRV in October 2022  I called patient, he had long history of intermittent vertigo, similar symptoms happened in the past, even prior to his diagnosis of cerebral venous thrombosis, this morning he noticed dizziness as soon as he he got from overnight sleep, on January 18, 4 hours into symptoms, he has much improved, denies headaches, denies focal symptoms  I have called in meclizine as needed,  Please give him a follow-up with Janett Billow in 1 to 2 weeks

## 2022-10-06 NOTE — Telephone Encounter (Signed)
Called pt. Informed him that Dr. Krista Blue would like him to follow-up with Janett Billow on 2/6 @ 12:15 pm. Pt said that works for him and I made the appointment.

## 2022-10-06 NOTE — Addendum Note (Signed)
Addended by: Marcial Pacas on: 10/06/2022 12:12 PM   Modules accepted: Orders

## 2022-10-06 NOTE — Telephone Encounter (Signed)
Pt reports that his symptoms have return this morning, he is experiencing some vertigo with vomiting and this is the 1st since his procedure/surgery.  Pt would like a call to discuss.

## 2022-10-06 NOTE — Telephone Encounter (Signed)
Called the patient back. Pt states that 2022 was diagnosed with Dural Venous Sinus Thrombosis. He has been doing well and recovered and overall had no issues.  Prior to this he would have episodes of dizziness/vertigo spells. States post procedure he had not had those symptoms. This morning he woke up with what he feels similar to vertigo. He says if he stares straight ahead and turns head fast it takes his eyes a moment to catch up and states that he normally would drive his wife to her appt but this morning felt to dizzy to drive. He has also had nausea associated and was only able to drink water. He did have a vomiting episode.  Given his extensive history he felt concerned and wasn't sure if this is something he needed to be evaluated for.  Per last ov with Janett Billow in September she states if he had new symptoms a referral would need to be made by PCP.  "Overall stable from neurological standpoint without further recommendations and risk factors are managed by PCP and oncology. He may follow up PRN, as usual for our patients who are strictly being followed for stroke. If any new neurological issues should arise, request PCP place referral for evaluation by one of our neurologists" I asked had he reached out to PCP and he had not. He just would like a MD to take a look at his chart and history and advise if he needs to go to ER because of this or if he needs to be seen. I advised that both Dr Leonie Man and Janett Billow are out of the office this week. I advised I would recommend reaching out to PCP since this is new symptoms from when we saw him and they would need to make sure their isnt anything else going on. He advised he would still like to have a MD review. Informed if our work in provider has any further recommendations I will reach out.

## 2022-10-07 ENCOUNTER — Other Ambulatory Visit: Payer: BC Managed Care – PPO

## 2022-10-07 DIAGNOSIS — R42 Dizziness and giddiness: Secondary | ICD-10-CM | POA: Diagnosis not present

## 2022-10-11 ENCOUNTER — Other Ambulatory Visit: Payer: Self-pay

## 2022-10-11 ENCOUNTER — Telehealth: Payer: Self-pay

## 2022-10-11 DIAGNOSIS — G08 Intracranial and intraspinal phlebitis and thrombophlebitis: Secondary | ICD-10-CM

## 2022-10-11 DIAGNOSIS — R42 Dizziness and giddiness: Secondary | ICD-10-CM

## 2022-10-11 DIAGNOSIS — Z86718 Personal history of other venous thrombosis and embolism: Secondary | ICD-10-CM

## 2022-10-11 NOTE — Telephone Encounter (Addendum)
Patient called to report several days of vertigo. Patient's PCP advised that it may be an isolated incident. Patient calling to ask Dr. Lorenso Courier if it could be related to his hx of thrombosis. He is also asking if Dr. Lorenso Courier would recommend any diagnostic imaging.  Per Dr. Lorenso Courier: Not likely to be related to his blood thinner or his history of blood clots. I would recommend a neurology evaluation prior to imaging. We could make a referral to Dr. Mickeal Skinner if they want this worked up further.   Referral placed. Patient aware of apt with Dr. Mickeal Skinner on 10/17/22 @ 9:30 AM.

## 2022-10-14 ENCOUNTER — Telehealth (HOSPITAL_COMMUNITY): Payer: Self-pay

## 2022-10-17 ENCOUNTER — Other Ambulatory Visit: Payer: Self-pay | Admitting: Internal Medicine

## 2022-10-17 ENCOUNTER — Inpatient Hospital Stay: Payer: BC Managed Care – PPO | Attending: Hematology and Oncology | Admitting: Internal Medicine

## 2022-10-17 VITALS — BP 137/74 | HR 71 | Temp 97.5°F | Resp 20 | Wt 197.8 lb

## 2022-10-17 DIAGNOSIS — Z86718 Personal history of other venous thrombosis and embolism: Secondary | ICD-10-CM | POA: Insufficient documentation

## 2022-10-17 DIAGNOSIS — R42 Dizziness and giddiness: Secondary | ICD-10-CM

## 2022-10-17 DIAGNOSIS — Z7901 Long term (current) use of anticoagulants: Secondary | ICD-10-CM | POA: Insufficient documentation

## 2022-10-17 DIAGNOSIS — G08 Intracranial and intraspinal phlebitis and thrombophlebitis: Secondary | ICD-10-CM

## 2022-10-17 MED ORDER — MECLIZINE HCL 25 MG PO CHEW: 25.0000 mg | CHEWABLE_TABLET | Freq: Two times a day (BID) | ORAL | 0 refills | Status: AC | PRN

## 2022-10-17 NOTE — Progress Notes (Signed)
[place] at [place], [place] [address] [phone]   New Patient Evaluation  Date of Service: 10/17/22 Patient Name: Shane Martin Patient MRN: [id number] Patient DOB: [birth year] Provider: Ventura Sellers, MD  Identifying Statement:  Shane Martin is a 47 y.o. male with Dural venous sinus thrombosis  Vertigo who presents for initial consultation and evaluation.    Referring Provider: Lujean Amel, MD [address],  [place] [address]  History of Present Illness: The patient's records from the referring physician were obtained and reviewed and the patient interviewed to confirm this HPI.  Shane Martin presents today to review recent neurologic complaints.  He describes multiple episodes of severe vertigo, described as "room moving and vision lagging like a slow phone".  Duration for most prominent episode was an entire day, and it was 2-3 days until he felt totally normal.  Denies double vision, slurred speech, weakness or numbness.  After first episode, had repeat milder syndrome later that week.  Hasn't dosed any medication.  On eliquis for DVT and known transverse sinus thrombus.  Expecting first child in ~1 month.    Medications: Current Outpatient Medications on File Prior to Visit  Medication Sig Dispense Refill   atorvastatin (LIPITOR) 20 MG tablet Take 1 tablet (20 mg total) by mouth daily. 90 tablet 0   ELIQUIS 5 MG TABS tablet TAKE 1 TABLET(5 MG) BY MOUTH TWICE DAILY 180 tablet 1   albuterol (VENTOLIN HFA) 108 (90 Base) MCG/ACT inhaler Inhale 2 puffs into the lungs every 6 (six) hours as needed (excercise induced asthma). (Patient not taking: Reported on 10/17/2022)     Meclizine HCl 25 MG CHEW Chew 1 tablet (25 mg total) by mouth 2 (two) times daily as needed. (Patient not taking: Reported on 10/17/2022) 30 tablet 0   No current facility-administered medications on file prior to visit.     Allergies: No Active Allergies Past Medical History:  Past Medical History:  Diagnosis Date   Anxiety    Asthma    Dural venous sinus thrombosis    DVT (deep venous thrombosis) (HCC)    Elevated LFTs    Prothrombin gene mutation (HCC)    Vertigo    Past Surgical History:  Past Surgical History:  Procedure Laterality Date   No prior surgery     Social History:  Social History   Socioeconomic History   Marital status: Married    Spouse name: Not on file   Number of children: Not on file   Years of education: Not on file   Highest education level: Not on file  Occupational History   Occupation: Building surveyor    Comment: [employer]  Tobacco Use   Smoking status: Never    Passive exposure: Never   Smokeless tobacco: Never  Vaping Use   Vaping Use: Never used  Substance and Sexual Activity   Alcohol use: No   Drug use: No   Sexual activity: Not on file  Other Topics Concern   Not on file  Social History Narrative   Not on file   Social Determinants of Health   Financial Resource Strain: Not on file  Food Insecurity: Not on file  Transportation Needs: Not on file  Physical Activity: Not on file  Stress: Not on file  Social Connections: Not on file  Intimate Partner Violence: Not on file   Family History:  Family History  Problem Relation Age of Onset  CAD Father 35    Review of Systems: Constitutional: Doesn't report fevers, chills or abnormal weight loss Eyes: Doesn't report blurriness of vision Ears, nose, mouth, throat, and face: Doesn't report sore throat Respiratory: Doesn't report cough, dyspnea or wheezes Cardiovascular: Doesn't report palpitation, chest discomfort  Gastrointestinal:  Doesn't report nausea, constipation, diarrhea GU: Doesn't report incontinence Skin: Doesn't report skin rashes Neurological: Per HPI Musculoskeletal: Doesn't report joint pain Behavioral/Psych: Doesn't report anxiety  Physical Exam: Vitals:   10/17/22  0925  BP: 137/74  Pulse: 71  Resp: 20  Temp: (!) 97.5 F (36.4 C)  SpO2: 100%   KPS: 90. General: Alert, cooperative, pleasant, in no acute distress Head: Normal EENT: No conjunctival injection or scleral icterus.  Lungs: Resp effort normal Cardiac: Regular rate Abdomen: Non-distended abdomen Skin: No rashes cyanosis or petechiae. Extremities: No clubbing or edema  Neurologic Exam: Mental Status: Awake, alert, attentive to examiner. Oriented to self and environment. Language is fluent with intact comprehension.  Cranial Nerves: Visual acuity is grossly normal. Visual fields are full. Extra-ocular movements intact. No ptosis. Face is symmetric Motor: Tone and bulk are normal. Power is full in both arms and legs. Reflexes are symmetric, no pathologic reflexes present.  Sensory: Intact to light touch Gait: Normal.   Labs: I have reviewed the data as listed    Component Value Date/Time   NA 141 08/03/2022 1359   NA 140 11/29/2021 0815   K 4.3 08/03/2022 1359   CL 106 08/03/2022 1359   CO2 29 08/03/2022 1359   GLUCOSE 101 (H) 08/03/2022 1359   BUN 20 08/03/2022 1359   BUN 23 11/29/2021 0815   CREATININE 1.09 08/03/2022 1359   CALCIUM 9.0 08/03/2022 1359   PROT 6.9 08/03/2022 1359   PROT 6.5 06/29/2022 0819   ALBUMIN 4.2 08/03/2022 1359   ALBUMIN 4.5 06/29/2022 0819   AST 43 (H) 08/03/2022 1359   ALT 74 (H) 08/03/2022 1359   ALKPHOS 63 08/03/2022 1359   BILITOT 1.0 08/03/2022 1359   GFRNONAA >60 08/03/2022 1359   GFRAA >60 06/29/2017 0115   Lab Results  Component Value Date   WBC 8.9 08/03/2022   NEUTROABS 5.4 08/03/2022   HGB 15.3 08/03/2022   HCT 43.3 08/03/2022   MCV 84.2 08/03/2022   PLT 213 08/03/2022    Assessment/Plan Dural venous sinus thrombosis  Vertigo  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    His history of large intracranial venous clot is noted.    We would recommend MRI brain alongside  MRV for characterization, given his history, hypercoagulable syndrome, prolonged unhabituated vertigo without positional association.    Also discussed trial of meclizine 25mg  PRN vertigo symptoms.  He is agreeable with this.  We spent twenty additional minutes teaching regarding the natural history, biology, and historical experience in the treatment of neurologic conditions.   We appreciate the opportunity to participate in the care of [facility].  We will give him a call with his scan results, once available.  All questions were answered. The patient knows to call the clinic with any problems, questions or concerns. No barriers to learning were detected.  The total time spent in the encounter was 40 minutes and more than 50% was on counseling and review of test results   Shane Sellers, MD Medical Director of Neuro-Oncology [facility] [place] 10/17/22 10:47 AM

## 2022-10-18 ENCOUNTER — Other Ambulatory Visit: Payer: Self-pay | Admitting: Radiation Therapy

## 2022-10-18 ENCOUNTER — Telehealth: Payer: Self-pay | Admitting: Internal Medicine

## 2022-10-18 NOTE — Telephone Encounter (Signed)
Called patient per 1/29 los notes to schedule f/u. Patient scheduled and notified.

## 2022-10-21 ENCOUNTER — Ambulatory Visit
Admission: RE | Admit: 2022-10-21 | Discharge: 2022-10-21 | Disposition: A | Discharge status: Home / Self Care | Payer: BC Managed Care – PPO | Source: Ambulatory Visit | Attending: Internal Medicine | Admitting: Internal Medicine

## 2022-10-21 DIAGNOSIS — R42 Dizziness and giddiness: Secondary | ICD-10-CM | POA: Diagnosis not present

## 2022-10-21 DIAGNOSIS — G08 Intracranial and intraspinal phlebitis and thrombophlebitis: Secondary | ICD-10-CM

## 2022-10-24 ENCOUNTER — Inpatient Hospital Stay: Payer: BC Managed Care – PPO | Attending: Hematology and Oncology | Admitting: Internal Medicine

## 2022-10-24 ENCOUNTER — Inpatient Hospital Stay: Payer: BC Managed Care – PPO

## 2022-10-24 DIAGNOSIS — R42 Dizziness and giddiness: Secondary | ICD-10-CM | POA: Diagnosis not present

## 2022-10-24 MED ORDER — ATORVASTATIN CALCIUM 20 MG PO TABS: 20.0000 mg | ORAL_TABLET | Freq: Every day | ORAL | 3 refills | Status: DC

## 2022-10-24 NOTE — Progress Notes (Signed)
I connected with Shane Martin on 10/24/22 at  2:00 PM EST by telephone visit and verified that I am speaking with the correct person using two identifiers.  I discussed the limitations, risks, security and privacy concerns of performing an evaluation and management service by telemedicine and the availability of in-person appointments. I also discussed with the patient that there may be a patient responsible charge related to this service. The patient expressed understanding and agreed to proceed.   Other persons participating in the visit and their role in the encounter:  n/a   Patient's location:  Home Provider's location:  Office Chief Complaint:  Vertigo  History of Present Ilness: Shane Martin reports no clinical changes today.  He has had some episodes of mild vertigo, all self limited.  No severe episodes as prior.  Denies headaches, cognitive changes.  Observations: Language and cognition at baseline  Imaging:  [place] Clinician Interpretation: I have personally reviewed the CNS images as listed.  My interpretation, in the context of the patient's clinical presentation, is stable disease  MR BRAIN WO CONTRAST  Result Date: 10/22/2022 CLINICAL DATA:  Vertigo EXAM: MRI HEAD WITHOUT CONTRAST MRV HEAD WITHOUT CONTRAST TECHNIQUE: Multiplanar, multi-echo pulse sequences of the brain and surrounding structures were acquired without intravenous contrast. Angiographic images of the intracranial venous structures were acquired using MRV technique without intravenous contrast. COMPARISON:  MRV brain 10/27/2021 FINDINGS: MRI HEAD WITHOUT CONTRAST Brain: No acute infarct, mass effect or extra-axial collection. No chronic microhemorrhage or siderosis. Normal white matter signal, parenchymal volume and CSF spaces. The midline structures are normal. Vascular: Normal flow voids. Skull and upper cervical spine: Normal marrow signal. Sinuses/Orbits: No acute or significant finding. Other: None. MR VENOGRAM  WITHOUT CONTRAST Superior sagittal sinus: Normal. Straight sinus: Normal. Inferior sagittal sinus, vein of Galen and internal cerebral veins: Normal. Transverse sinuses: Unchanged lack of flow related enhancement in the right transverse sinus Sigmoid sinuses: Unchanged lack of enhancement in the right sigmoid sinus Visualized jugular veins: Unchanged lack of enhancement in the right jugular bulb and visualized portion of the right internal jugular vein IMPRESSION: 1. No acute intracranial abnormality. 2. Unchanged thrombosis of the right transverse and sigmoid sinuses and right internal jugular vein. Electronically Signed   By: Ulyses Jarred M.D.   On: 10/22/2022 01:16   MR MRV HEAD WO CM  Result Date: 10/22/2022 CLINICAL DATA:  Vertigo EXAM: MRI HEAD WITHOUT CONTRAST MRV HEAD WITHOUT CONTRAST TECHNIQUE: Multiplanar, multi-echo pulse sequences of the brain and surrounding structures were acquired without intravenous contrast. Angiographic images of the intracranial venous structures were acquired using MRV technique without intravenous contrast. COMPARISON:  MRV brain 10/27/2021 FINDINGS: MRI HEAD WITHOUT CONTRAST Brain: No acute infarct, mass effect or extra-axial collection. No chronic microhemorrhage or siderosis. Normal white matter signal, parenchymal volume and CSF spaces. The midline structures are normal. Vascular: Normal flow voids. Skull and upper cervical spine: Normal marrow signal. Sinuses/Orbits: No acute or significant finding. Other: None. MR VENOGRAM WITHOUT CONTRAST Superior sagittal sinus: Normal. Straight sinus: Normal. Inferior sagittal sinus, vein of Galen and internal cerebral veins: Normal. Transverse sinuses: Unchanged lack of flow related enhancement in the right transverse sinus Sigmoid sinuses: Unchanged lack of enhancement in the right sigmoid sinus Visualized jugular veins: Unchanged lack of enhancement in the right jugular bulb and visualized portion of the right internal jugular  vein IMPRESSION: 1. No acute intracranial abnormality. 2. Unchanged thrombosis of the right transverse and sigmoid sinuses and right internal jugular vein. Electronically Signed  By: Ulyses Jarred M.D.   On: 10/22/2022 01:16    Assessment and Plan: Vertigo  Symptoms are likely vestibular in nature.  MRI/MRV were normal, venous thrombosis is stable from prior with very mild recanalization follow anticoagulation.  He will con't to dose Meclizine PRN.  Will hold off on ENT referral for now.  Ok to [facility] [employer], will refill today.  Follow Up Instructions: RTC as needed  I discussed the assessment and treatment plan with the patient.  The patient was provided an opportunity to ask questions and all were answered.  The patient agreed with the plan and demonstrated understanding of the instructions.    The patient was advised to call back or seek an in-person evaluation if the symptoms worsen or if the condition fails to improve as anticipated.    Ventura Sellers, MD   I provided 22 minutes of non face-to-face telephone visit time during this encounter, and > 50% was spent counseling as documented under my assessment & plan.

## 2022-10-24 NOTE — Progress Notes (Deleted)
[facility] [address] [place]. [place] [address] [phone]       OFFICE FOLLOW UP NOTE  [name] Date of Birth:  03/28/1976 Medical Record Number:  [id number]    Primary neurologist: Dr. Leonie Martin Reason for Referral:  stroke follow up    SUBJECTIVE:   CHIEF COMPLAINT:  No chief complaint on file.   HPI:   Update 10/25/2022 JM: Patient returns for acute visit due to multiple episodes of vertigo.  Was seen by oncology 2/3 who ordered MRI brain which showed no acute intracranial abnormality and MRV head which showed unchanged thrombosis at right transverse and sigmoid sinuses and right internal jugular vein.        History provided for reference purposes only Update 05/31/2022 JM: Patient returns for 64-monthfollow-up.  He has been stable since prior visit. He does have occasional very mild headaches but seems to correlate with lack of sleep, resolved on own. He has been trying to increase his daily activity and running (up to 5 mi per day) but has noticed at times can feel tightness/stiffness on right side of neck/upper back, will resolve after resting/walking for short duration.  He is concerned that the symptoms occur on the same side of his clot.  He has remained on Eliquis 5 mg twice daily and atorvastatin '20mg'$  daily, denies side effects. Has been having issues with elevated LFTs (reports liver ultrasound showed beginning of fatty liver), recent check looked good, he questions if we can repeat lipid panel and LFT's today.  Routinely follows with hem/onc Dr. DLorenso Martin  Blood pressure today 134/79.  He questions need of repeat imaging.  No further concerns at this time.  Update 11/24/2021 Dr. SLeonie Martin He returns for follow-up after last visit 3 months ago.  He is accompanied by his wife.  Continues to do well and recurrent neurovascular symptoms.  Did have mild posterior headache for a month or 2 after episode of cerebral venous sinus in October 20 improved.   Occasionally gets a minor headache described as a sensation of pressure while bending down but it  does not stay long and has not needed any medicines.  He had a follow-up MR venogram of the brain done on 10/27/2021 which showed trace occult flow in the transverse sinus persistent fluid in the right sigmoid and jugular vein.  Patient is extremely anxious about going back to his physical activity, work schedule and planning to start a family soon.  He has no other complaints.  He does follow-up with hematologist Dr. 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   Initial visit 08/24/2021 JM: Patient being seen for initial hospital follow-up accompanied by his wife, TJonelle Martin   Overall doing well since discharge and gradually improving.  Does report occasional very slight headache. Doesn't stop him from working or it not debilitating.  Has experienced a zap type sensation on the right side of his head very slight and only a couple times. Also reports right ear tinnitus and possibly present prior. Also reports occasional "clicking" sensation when he swallows his saliva while laying in bed. He did not notice this prior.  Denies actual difficulty swallowing.  Denies any visual changes, neck discomfort or cognitive changes.  Compliant on Eliquis without side effects Evaluated by hematology who recommended indefinite anticoagulation.  He did not start atorvastatin  at discharge as he was unsure if this was really needed.  He is willing to start if needed. Blood pressure today 126/72 Denies any tobacco or EtOH use  He and his wife have multiple appropriate questions regarding activity level and any restrictions. He has been going on some walks but wondering if he can start getting back in to running and weight lifting.  Also questions ability to do things around the home such as painting and remodeling (they found out they were pregnant a week and after he was discharged home!!).  No further concerns at this time   Hospitalization 07/18/2021 [name] is a 47 y.o. male with history of anxiety, asthma, DVT, transaminitis, and vertigo who presented to ED on 07/18/2021 with c/o HA x 5 days accompanied by n/v. He described the HA as constant, worsening, achy, sharp at times, and throbbing. He initially thought it was "sinus" and took Ibuprofen with mild improvement. He has not recently had a fever, chills, diarrhea, viral illness, cold, or COVID. Associated symptoms include neck pain.  Personally reviewed hospitalization pertinent progress notes, lab work and imaging.  Evaluated by Dr. 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  MRI negative.  MRV showed extensive thrombosis of the right transverse sinus through the sigmoid sinus and into the upper right internal jugular sinus.  Hypercoagulable work-up pending at discharge.  LDL 115.  A1c 5.2.  Initially on heparin IV and transitioned to Eliquis 5 mg twice daily.  Prior history of DVT 5 years ago in setting of ankle injury and recently received COVID booster 2 weeks prior.  Advised to follow-up with hematology and neurology after discharge.       PERTINENT IMAGING  Per recent hospitalization CT head asymmetric hyperdensity of the right transverse and sigmoid dural venous sinuses, highly suspicious for dural venous sinus thrombosis.  MRI  normal, no infarct or ICH MRV Extensive thrombosis of the right transverse sinus through the sigmoid sinus and into the upper right internal jugular sinus. LDL 115 HgbA1c 5.2   ROS:   14 system review of systems performed and negative with exception of those listed in HPI  PMH:  Past Medical History:  Diagnosis Date   Anxiety    Asthma    Dural venous sinus thrombosis    DVT (deep venous  thrombosis) (HCC)    Elevated LFTs    Prothrombin gene mutation (HCC)    Vertigo     PSH:  Past Surgical History:  Procedure Laterality Date   No prior surgery      Social History:  Social History   Socioeconomic History   Marital status: Married    Spouse name: Not on file   Number of children: Not on file   Years of education: Not on file   Highest education level: Not on file  Occupational History   Occupation: Building surveyor    Comment: [employer]  Tobacco Use   Smoking status: Never    Passive exposure: Never   Smokeless tobacco: Never  Vaping Use   Vaping Use: Never used  Substance and Sexual Activity   Alcohol use: No   Drug use: No   Sexual activity: Not on file  Other Topics Concern   Not on file  Social History Narrative   Not on file   Social Determinants of Health   Financial Resource Strain: Not on file  Food Insecurity: Not on file  Transportation Needs: Not on file  Physical Activity: Not on  file  Stress: Not on file  Social Connections: Not on file  Intimate Partner Violence: Not on file    Family History:  Family History  Problem Relation Age of Onset   CAD Father 49    Medications:   Current Outpatient Medications on File Prior to Visit  Medication Sig Dispense Refill   albuterol (VENTOLIN HFA) 108 (90 Base) MCG/ACT inhaler Inhale 2 puffs into the lungs every 6 (six) hours as needed (excercise induced asthma). (Patient not taking: Reported on 10/17/2022)     atorvastatin (LIPITOR) 20 MG tablet Take 1 tablet (20 mg total) by mouth daily. 90 tablet 0   ELIQUIS 5 MG TABS tablet TAKE 1 TABLET(5 MG) BY MOUTH TWICE DAILY 180 tablet 1   Meclizine HCl 25 MG CHEW Chew 1 tablet (25 mg total) by mouth 2 (two) times daily as needed. 30 tablet 0   No current facility-administered medications on file prior to visit.    Allergies:   No Active Allergies     OBJECTIVE:  Physical Exam  There were no vitals filed for this visit.  There is  no height or weight on file to calculate BMI. No results found.   General: well developed, well nourished, mildly anxious very pleasant middle-age Caucasian male, seated, in no evident distress.  Seems anxious Head: head normocephalic and atraumatic.   Neck: supple with no carotid or supraclavicular bruits Cardiovascular: regular rate and rhythm, no murmurs Musculoskeletal: no deformity Skin:  no rash/petichiae Vascular:  Normal pulses all extremities   Neurologic Exam Mental Status: Awake and fully alert.  Fluent speech and language.  Oriented to place and time. Recent and remote memory intact. Attention span, concentration and fund of knowledge appropriate. Mood and affect appropriate.  Cranial Nerves: Pupils equal, briskly reactive to light. Extraocular movements full without nystagmus. Visual fields full to confrontation. Hearing intact. Facial sensation intact. Face, tongue, palate moves normally and symmetrically.  Motor: Normal bulk and tone. Normal strength in all tested extremity muscles Sensory.: intact to touch , pinprick , position and vibratory sensation.  Coordination: Rapid alternating movements normal in all extremities. Finger-to-nose and heel-to-shin performed accurately bilaterally. Gait and Station: Arises from chair without difficulty. Stance is normal. Gait demonstrates normal stride length and balance without use of AD. Tandem walk and heel toe without difficulty.  Reflexes: 1+ and symmetric. Toes downgoing.         ASSESSMENT/PLAN: Jayveon Demo is a 47 y.o. 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 Vascular risk factors include HLD and history of DVT.  Negative hypercoagulable panel labs except for heterozygote state for prothrombin gene mutation      R CVST Continue Eliquis 5 mg twice daily and atorvastatin 20 mg daily for secondary stroke prevention measures MRV head  10/2021 right transverse sinus partial recanalization, right sigmoid sinus and right internal jugular likely residual thrombus. As he has been doing well without any significant headaches or neurological deficits/symptoms and currently on Eliquis, no indication for repeat imaging at this time.  Did discuss warning signs/red flag symptoms and indication to proceed to ED immediately for emergent evaluation Continue close follow-up with hematology/oncology Dr. Lorenso Martin Continue close PCP follow-up for aggressive stroke risk factor management including HLD with LDL goal<70 Repeat lipid panel and LFTs today, can consider decreasing atorvastatin dosage to 20 mg daily if lipid panel at or below goal  Right side neck/upper back pain: occurs with running. Suspect more musculoskeletal  in etiology. If symptoms persist, would recommend further f/u with PCP    Overall stable from neurological standpoint without further recommendations and risk factors are managed by PCP and oncology. He may follow up PRN, as usual for our patients who are strictly being followed for stroke. If any new neurological issues should arise, request PCP place referral for evaluation by one of our neurologists. Thank you.       I spent 31 minutes of face-to-face and non-face-to-face time with patient.  This included previsit chart review, lab review, study review, order entry, electronic health record documentation, patient education and discussion regarding above diagnoses and treatment plan and answered all other questions to patient satisfaction  Frann Rider, [facility]  [facility] [address] [place], [place] [address]  Phone [phone] Fax [phone] Note: This document was prepared with digital dictation and possible smart phrase technology. Any transcriptional errors that result from this process are unintentional.

## 2022-10-25 ENCOUNTER — Ambulatory Visit: Payer: BC Managed Care – PPO | Admitting: Adult Health

## 2022-10-27 ENCOUNTER — Ambulatory Visit: Payer: BC Managed Care – PPO | Admitting: Hematology and Oncology

## 2022-10-27 ENCOUNTER — Other Ambulatory Visit: Payer: BC Managed Care – PPO

## 2022-10-27 DIAGNOSIS — R42 Dizziness and giddiness: Secondary | ICD-10-CM | POA: Diagnosis not present

## 2022-10-27 DIAGNOSIS — E7800 Pure hypercholesterolemia, unspecified: Secondary | ICD-10-CM | POA: Diagnosis not present

## 2022-10-28 ENCOUNTER — Other Ambulatory Visit: Payer: BC Managed Care – PPO

## 2022-10-30 IMAGING — MR MR HEAD W/O CM
6 of 10 series · 29 of 48 positions shown · IV contrast (gadavist)
Comparison: CT head without contrast 07/18/2021

CLINICAL DATA: Headache. Personal history of DVT. Dural venous
sinus thrombosis suspected.

EXAM:
MRI HEAD WITHOUT CONTRAST
MR VENOGRAM HEAD WITHOUT AND WITH CONTRAST
TECHNIQUE: Multiplanar, multi-echo pulse sequences of the brain and surrounding
structures were acquired without and with intravenous contrast.
Angiographic images of the intracranial venous structures were
acquired using MRV technique without and with intravenous contrast.
CONTRAST:  9.2mL GADAVIST GADOBUTROL 1 MMOL/ML IV SOLN

[Series 4: DWI · axial · 3.0mm · 0.94mm/px · z∈[-69,+87]mm · 9 of 108 slices shown (1 of 2)]
[im 1/108]
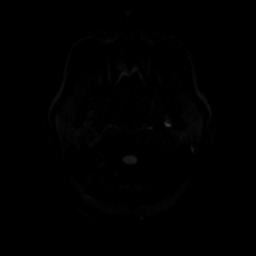
[im 14/108]
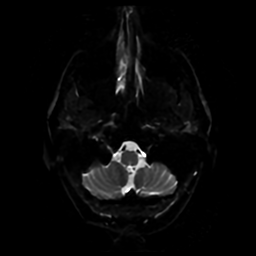
[im 27/108]
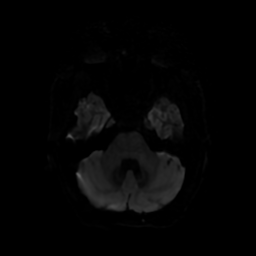
[im 41/108]
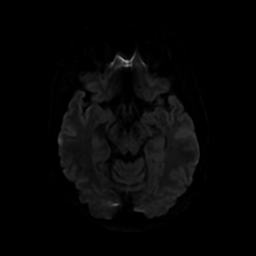
[im 54/108]
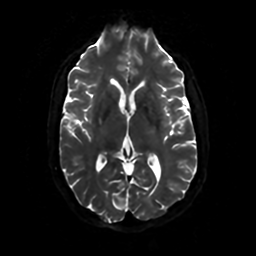
[im 67/108]
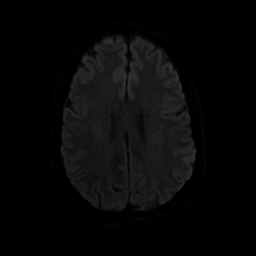
[im 81/108]
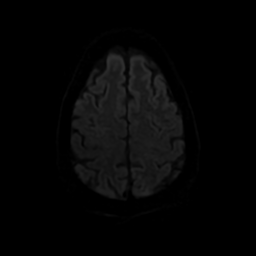
[im 94/108]
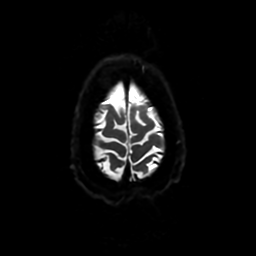
[im 108/108]
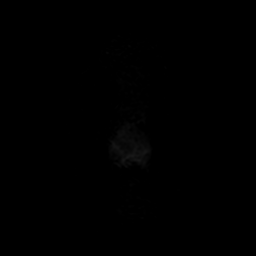

[Series 5: DWI · coronal · 4.0mm · 0.94mm/px · 7 of 78 slices shown (2 of 2)]
[im 1/78]
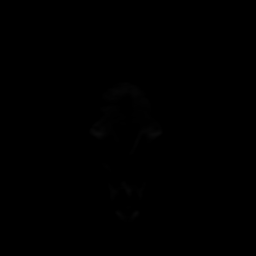
[im 13/78]
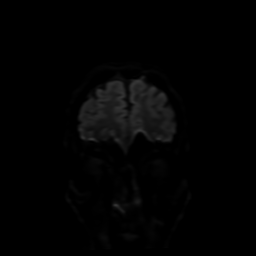
[im 26/78]
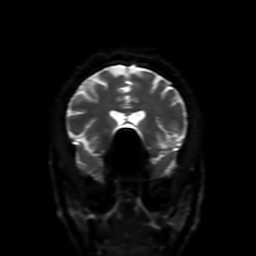
[im 39/78]
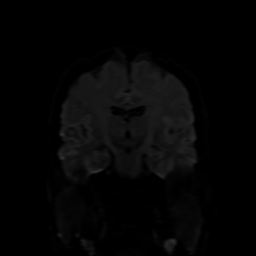
[im 52/78]
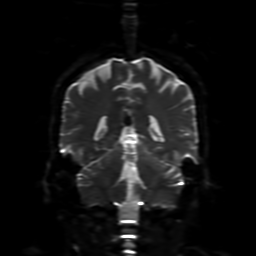
[im 65/78]
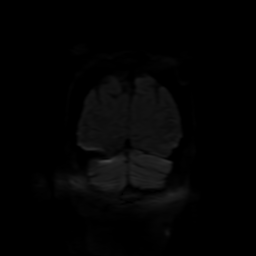
[im 78/78]
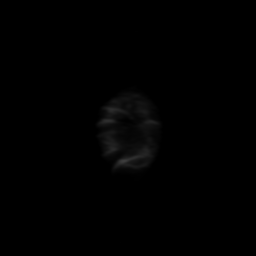

[Series 6: FLAIR · sagittal · 5.0mm · 0.23mm/px · 2 of 25 slices shown (1 of 2)]
[im 1/25]
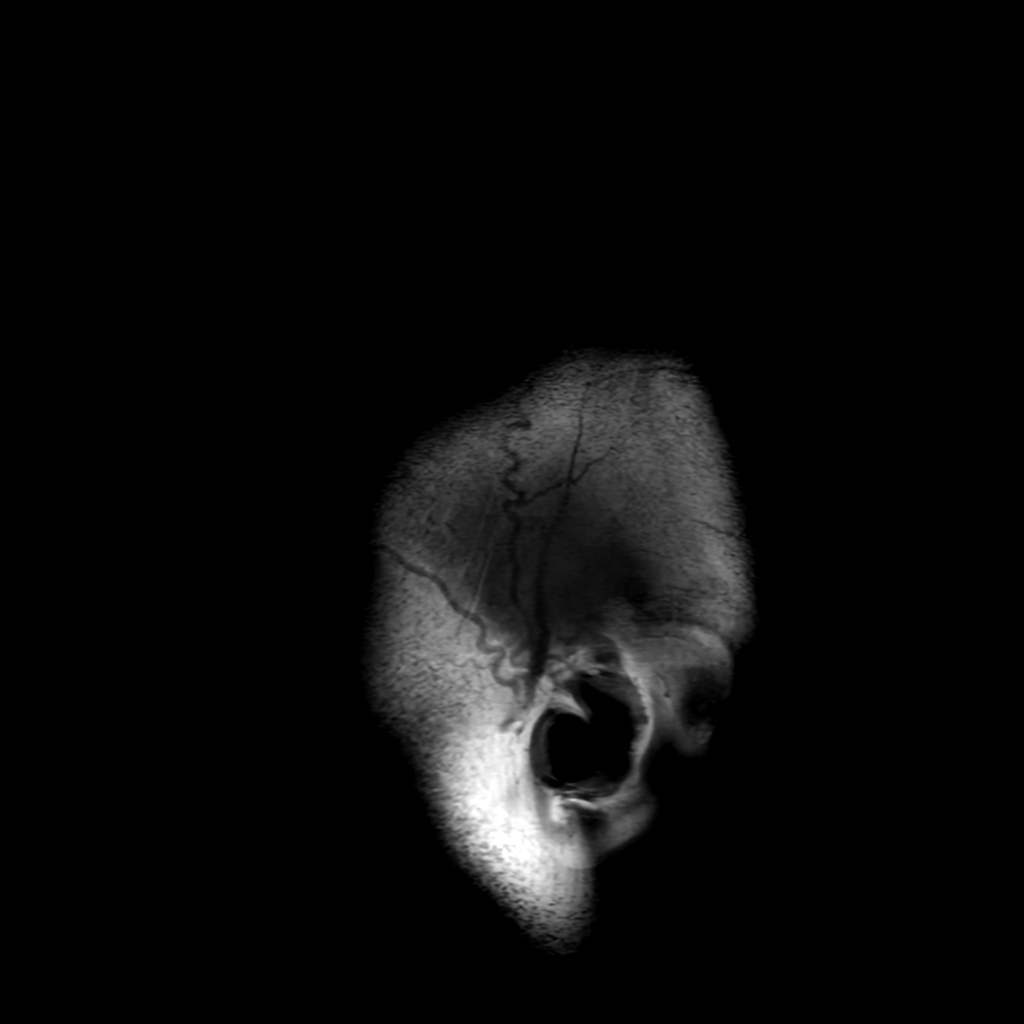
[im 25/25]
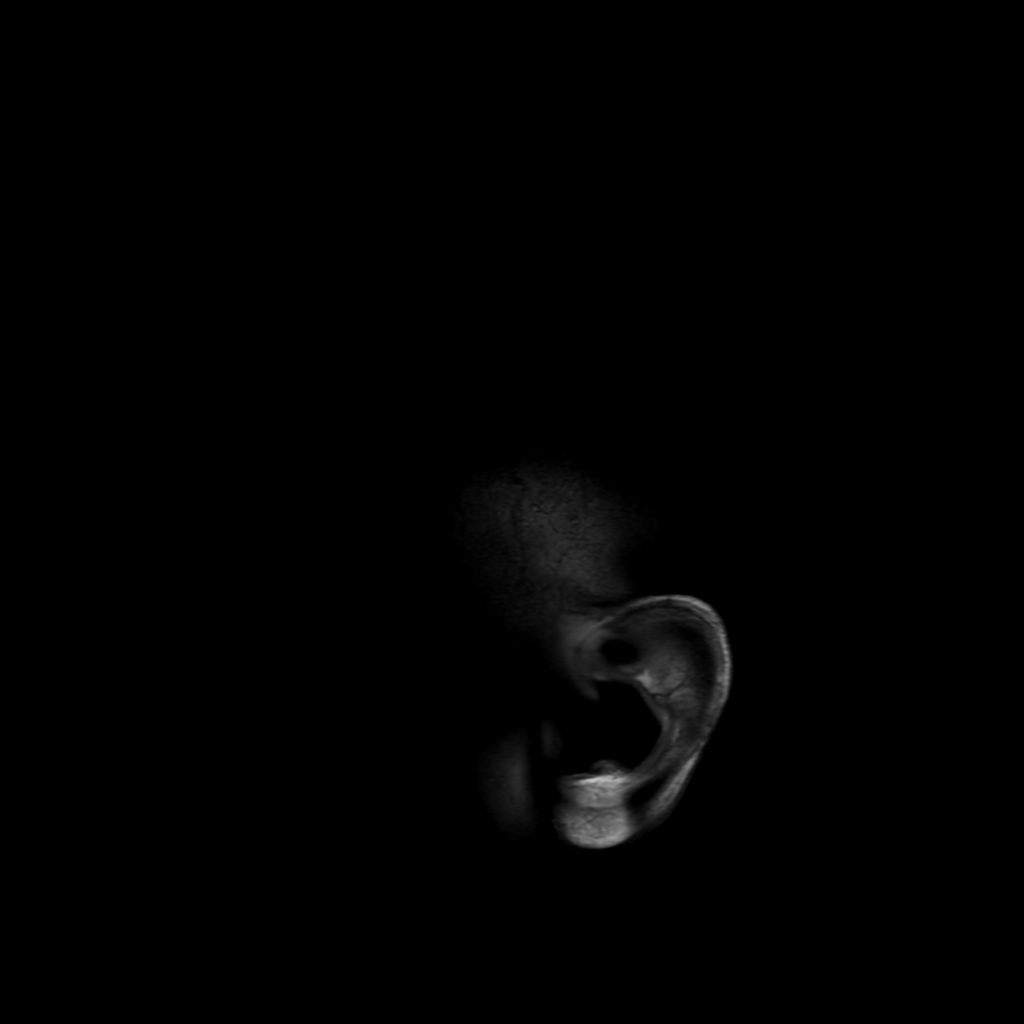

[Series 8: FLAIR · axial · 4.0mm · 0.45mm/px · z∈[-68,+87]mm · 3 of 37 slices shown (2 of 2)]
[im 1/37]
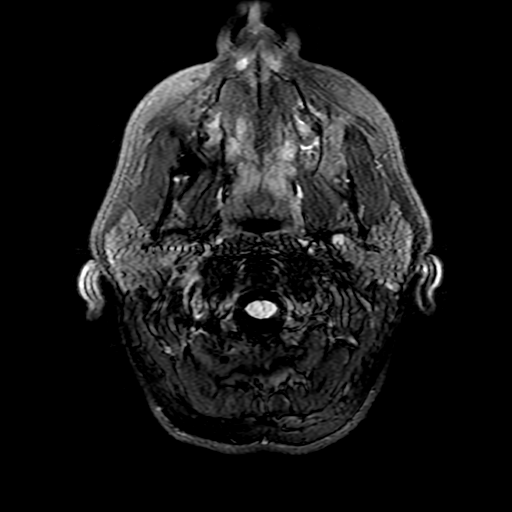
[im 19/37]
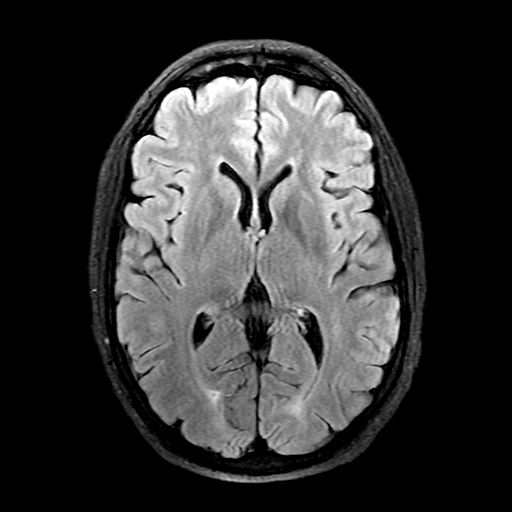
[im 37/37]
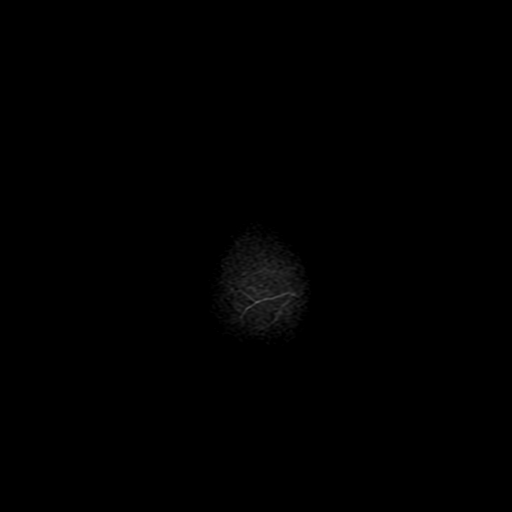

[Series 450: ADC · axial · 3.0mm · 0.94mm/px · z∈[-69,+87]mm · 5 of 54 slices shown (1 of 2)]
[im 1/54]
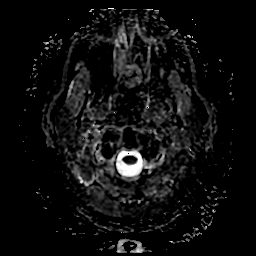
[im 14/54]
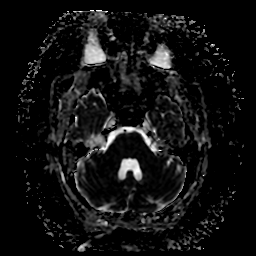
[im 27/54]
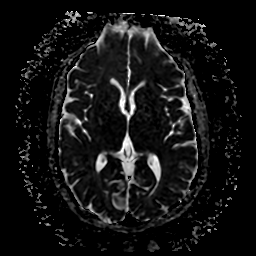
[im 40/54]
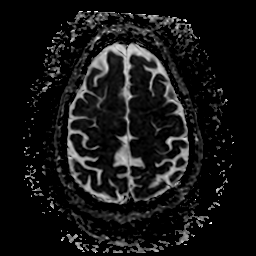
[im 54/54]
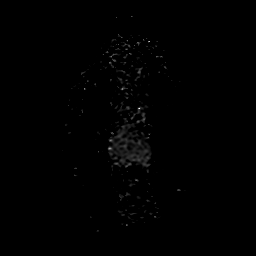

[Series 550: ADC · coronal · 4.0mm · 0.94mm/px · 3 of 39 slices shown (2 of 2)]
[im 1/39]
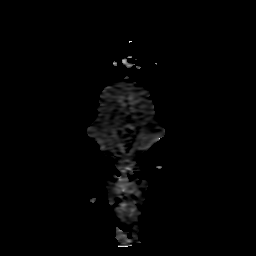
[im 20/39]
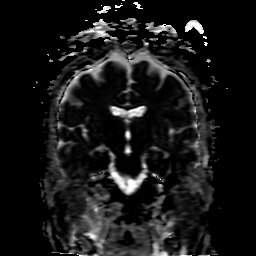
[im 39/39]
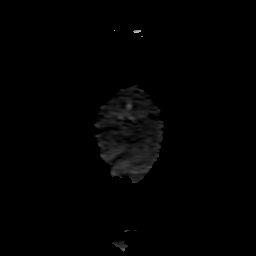

[29 of 48 positions shown; findings below may reference images not displayed]

FINDINGS: MRI HEAD WITHOUT AND WITH CONTRAST

Brain:

Diffusion-weighted images demonstrate no acute infarct. No acute
hemorrhage or mass lesion is present.

No significant white matter lesions are present. The ventricles are
of normal size. No significant extraaxial fluid collection is
present.

The internal auditory canals are within normal limits. The brainstem
and cerebellum are within normal limits

Vascular: Marked susceptibility artifact is evident adjacent to the
right transverse sinus. Flow is present in the major intracranial
arteries.

Skull and upper cervical spine: The craniocervical junction is
normal. Upper cervical spine is within normal limits. Marrow signal
is unremarkable.

Sinuses/Orbits: Small amount of fluid is present in the mastoid air
cells bilaterally. No obstructing nasopharyngeal lesion is present.
The paranasal sinuses and mastoid air cells are otherwise clear. The
globes and orbits are within normal limits.

MR VENOGRAM HEAD WITHOUT AND WITH CONTRAST

Pre and postcontrast images demonstrate extensive thrombosis in the
right transverse sinus through the sigmoid sinus and into the upper
right internal jugular sinus. No associated hemorrhage. The dural
sinuses are otherwise patent. The left transverse sinus is patent.
IMPRESSION: 1. Extensive thrombosis of the right transverse sinus through the
sigmoid sinus and into the upper right internal jugular sinus.
2. No associated hemorrhage.
3. Normal MRI appearance of the brain.
4. Small amount of fluid in the mastoid air cells bilaterally.

## 2022-10-30 IMAGING — MR MR [PERSON_NAME] HEAD
4 of 5 series · 18 of 48 positions shown · IV contrast (YES GAD)
Comparison: CT head without contrast 07/18/2021

CLINICAL DATA: Headache. Personal history of DVT. Dural venous
sinus thrombosis suspected.

EXAM:
MRI HEAD WITHOUT CONTRAST
MR VENOGRAM HEAD WITHOUT AND WITH CONTRAST
TECHNIQUE: Multiplanar, multi-echo pulse sequences of the brain and surrounding
structures were acquired without and with intravenous contrast.
Angiographic images of the intracranial venous structures were
acquired using MRV technique without and with intravenous contrast.
CONTRAST:  9.2mL GADAVIST GADOBUTROL 1 MMOL/ML IV SOLN

[Series 2: MRV · coronal · 1.5mm · 0.43mm/px · 6 of 137 slices shown]
[im 1/137]
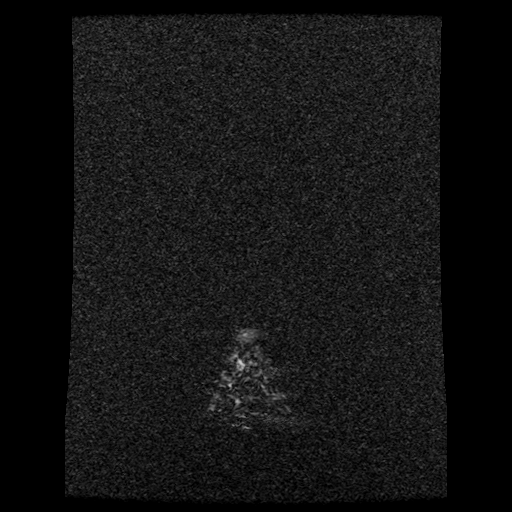
[im 28/137]
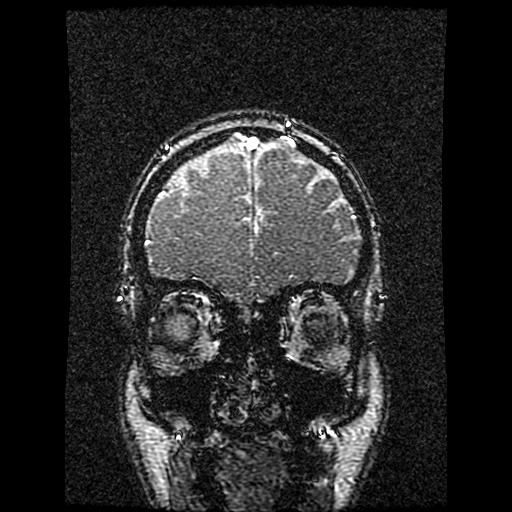
[im 55/137]
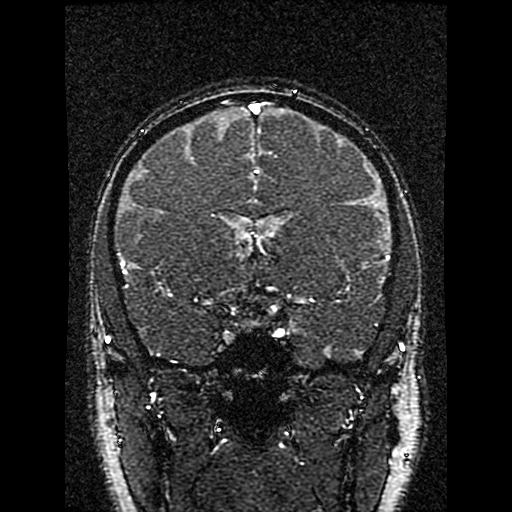
[im 82/137]
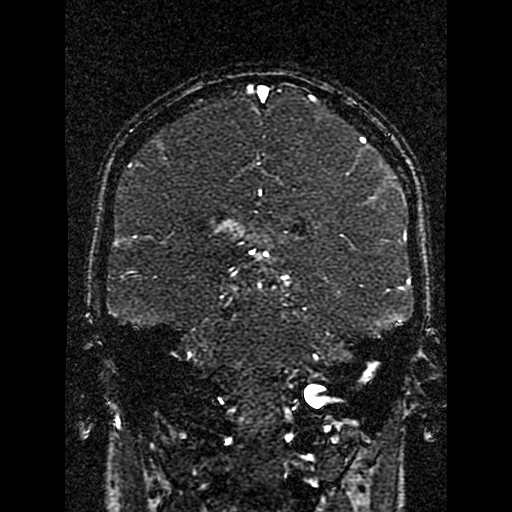
[im 109/137]
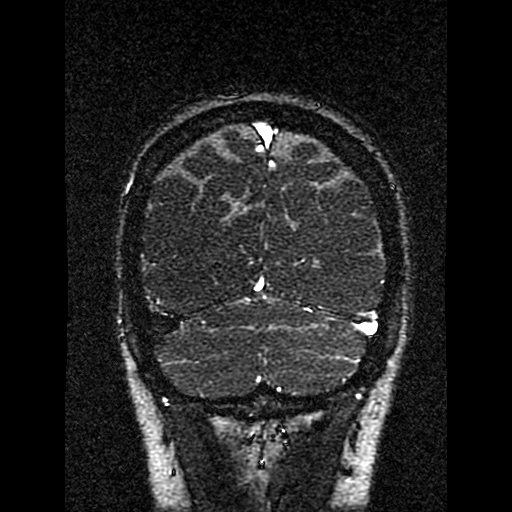
[im 137/137]
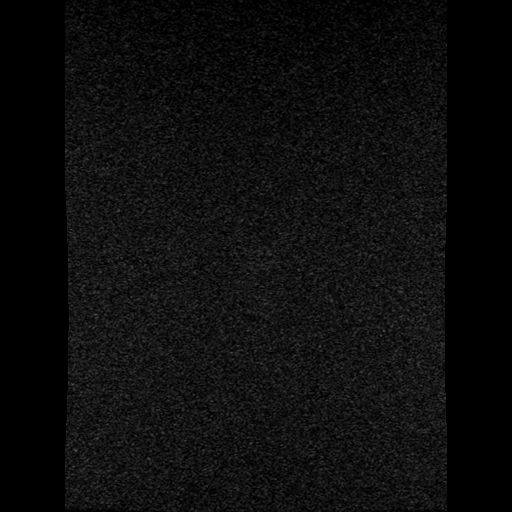

[Series 3: sag inhance (id) · sagittal · 1.8mm · 0.47mm/px · 6 of 333 slices shown]
[im 1/333]
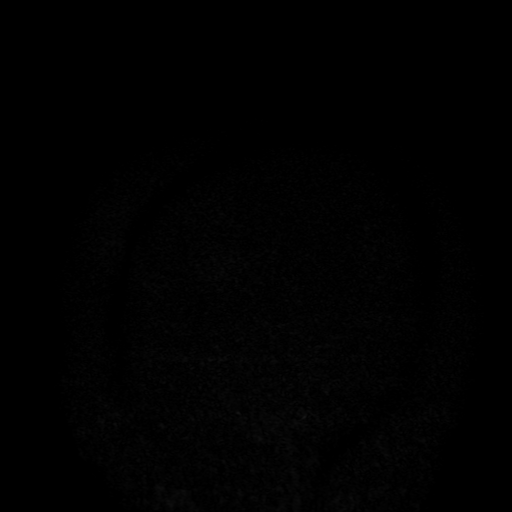
[im 48/333]
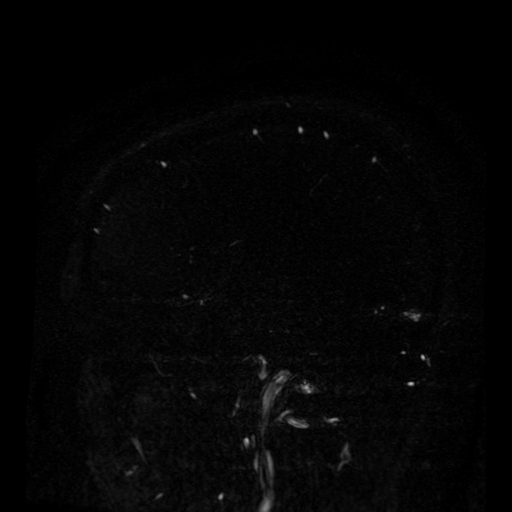
[im 95/333]
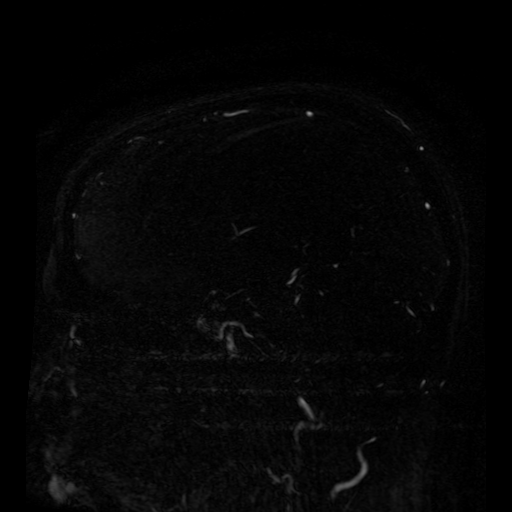
[im 143/333]
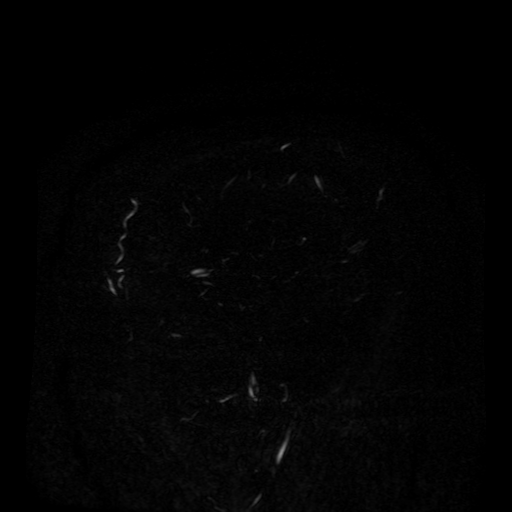
[im 167/333]
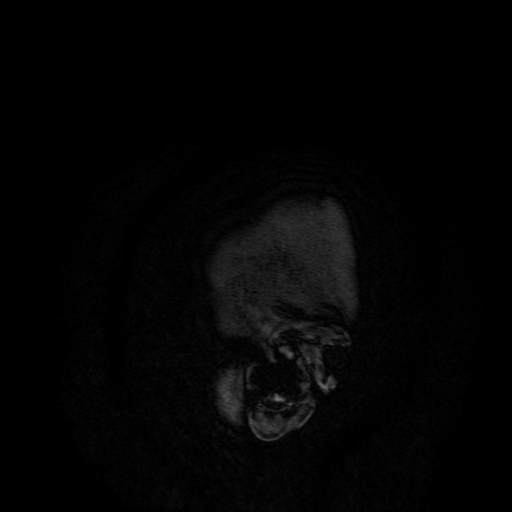
[im 285/333]
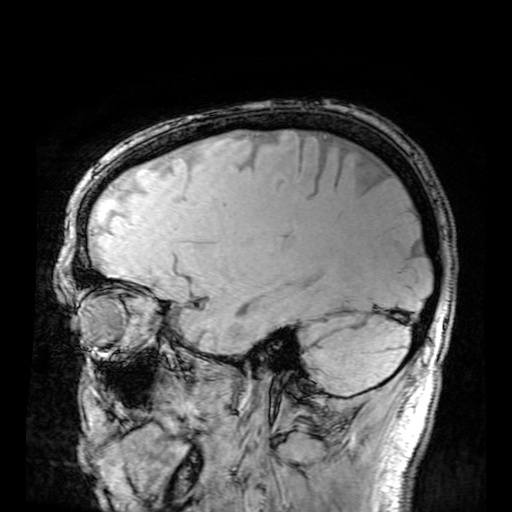

[Series 1200: multiplanar reconstruction (mpr) · sagittal · 0.9mm · 0.47mm/px · 3 of 205 slices shown (1 of 2)]
[im 26/205]
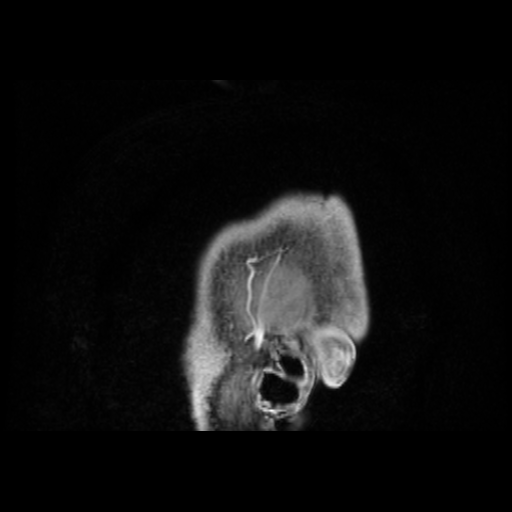
[im 103/205]
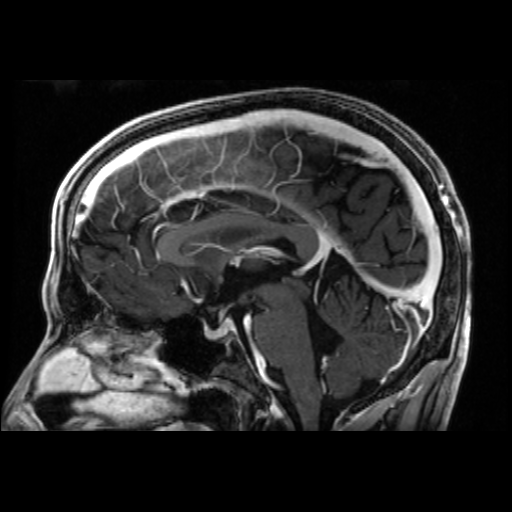
[im 179/205]
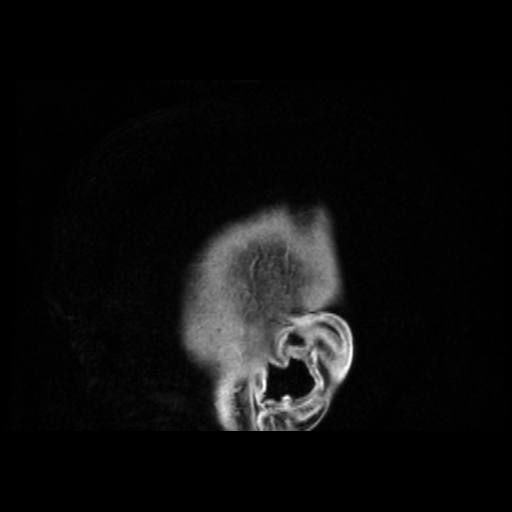

[Series 1201: multiplanar reconstruction (mpr) · coronal · 0.9mm · 0.47mm/px · 3 of 255 slices shown (2 of 2)]
[im 26/255]
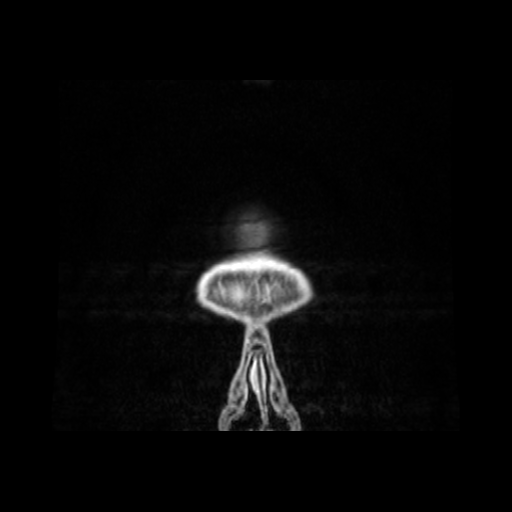
[im 128/255]
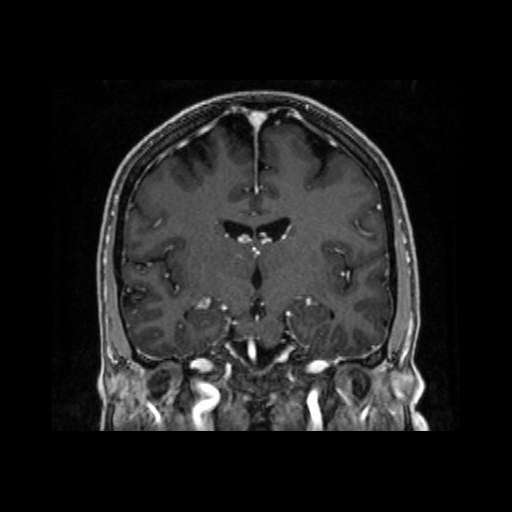
[im 229/255]
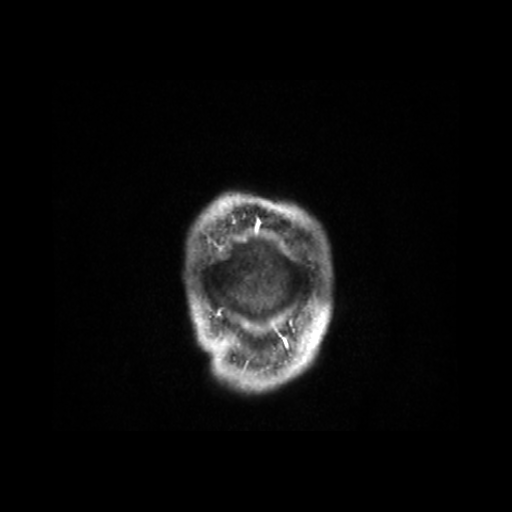

[18 of 48 positions shown; findings below may reference images not displayed]

FINDINGS: MRI HEAD WITHOUT AND WITH CONTRAST

Brain:

Diffusion-weighted images demonstrate no acute infarct. No acute
hemorrhage or mass lesion is present.

No significant white matter lesions are present. The ventricles are
of normal size. No significant extraaxial fluid collection is
present.

The internal auditory canals are within normal limits. The brainstem
and cerebellum are within normal limits

Vascular: Marked susceptibility artifact is evident adjacent to the
right transverse sinus. Flow is present in the major intracranial
arteries.

Skull and upper cervical spine: The craniocervical junction is
normal. Upper cervical spine is within normal limits. Marrow signal
is unremarkable.

Sinuses/Orbits: Small amount of fluid is present in the mastoid air
cells bilaterally. No obstructing nasopharyngeal lesion is present.
The paranasal sinuses and mastoid air cells are otherwise clear. The
globes and orbits are within normal limits.

MR VENOGRAM HEAD WITHOUT AND WITH CONTRAST

Pre and postcontrast images demonstrate extensive thrombosis in the
right transverse sinus through the sigmoid sinus and into the upper
right internal jugular sinus. No associated hemorrhage. The dural
sinuses are otherwise patent. The left transverse sinus is patent.
IMPRESSION: 1. Extensive thrombosis of the right transverse sinus through the
sigmoid sinus and into the upper right internal jugular sinus.
2. No associated hemorrhage.
3. Normal MRI appearance of the brain.
4. Small amount of fluid in the mastoid air cells bilaterally.

## 2022-11-01 DIAGNOSIS — R42 Dizziness and giddiness: Secondary | ICD-10-CM | POA: Diagnosis not present

## 2022-11-01 DIAGNOSIS — E7800 Pure hypercholesterolemia, unspecified: Secondary | ICD-10-CM | POA: Diagnosis not present

## 2023-01-02 DIAGNOSIS — B353 Tinea pedis: Secondary | ICD-10-CM | POA: Diagnosis not present

## 2023-01-02 DIAGNOSIS — D692 Other nonthrombocytopenic purpura: Secondary | ICD-10-CM | POA: Diagnosis not present

## 2023-01-02 DIAGNOSIS — B351 Tinea unguium: Secondary | ICD-10-CM | POA: Diagnosis not present

## 2023-02-09 ENCOUNTER — Inpatient Hospital Stay (HOSPITAL_BASED_OUTPATIENT_CLINIC_OR_DEPARTMENT_OTHER): Payer: BC Managed Care – PPO | Admitting: Hematology and Oncology

## 2023-02-09 ENCOUNTER — Inpatient Hospital Stay: Payer: BC Managed Care – PPO | Attending: Hematology and Oncology

## 2023-02-09 ENCOUNTER — Other Ambulatory Visit: Payer: Self-pay | Admitting: Hematology and Oncology

## 2023-02-09 VITALS — BP 127/78 | HR 69 | Temp 98.3°F | Resp 18 | Wt 201.3 lb

## 2023-02-09 DIAGNOSIS — G08 Intracranial and intraspinal phlebitis and thrombophlebitis: Secondary | ICD-10-CM

## 2023-02-09 DIAGNOSIS — D6852 Prothrombin gene mutation: Secondary | ICD-10-CM | POA: Insufficient documentation

## 2023-02-09 DIAGNOSIS — Z86718 Personal history of other venous thrombosis and embolism: Secondary | ICD-10-CM

## 2023-02-09 DIAGNOSIS — Z7901 Long term (current) use of anticoagulants: Secondary | ICD-10-CM | POA: Diagnosis not present

## 2023-02-09 DIAGNOSIS — I676 Nonpyogenic thrombosis of intracranial venous system: Secondary | ICD-10-CM | POA: Insufficient documentation

## 2023-02-09 LAB — CMP (CANCER CENTER ONLY)
ALT: 51 U/L — ABNORMAL HIGH (ref 0–44)
AST: 31 U/L (ref 15–41)
Albumin: 4.3 g/dL (ref 3.5–5.0)
Alkaline Phosphatase: 61 U/L (ref 38–126)
Anion gap: 5 (ref 5–15)
BUN: 17 mg/dL (ref 6–20)
CO2: 29 mmol/L (ref 22–32)
Calcium: 9 mg/dL (ref 8.9–10.3)
Chloride: 105 mmol/L (ref 98–111)
Creatinine: 0.91 mg/dL (ref 0.61–1.24)
GFR, Estimated: >60 mL/min (ref >60)
Glucose, Bld: 120 mg/dL — ABNORMAL HIGH (ref 70–99)
Potassium: 4.1 mmol/L (ref 3.5–5.1)
Sodium: 139 mmol/L (ref 135–145)
Total Bilirubin: 0.9 mg/dL (ref 0.3–1.2)
Total Protein: 6.8 g/dL (ref 6.5–8.1)

## 2023-02-09 LAB — CBC WITH DIFFERENTIAL (CANCER CENTER ONLY)
Abs Immature Granulocytes: 0.03 10*3/uL (ref 0.00–0.07)
Basophils Absolute: 0 10*3/uL (ref 0.0–0.1)
Basophils Relative: 0 %
Eosinophils Absolute: 0.1 10*3/uL (ref 0.0–0.5)
Eosinophils Relative: 2 %
HCT: 44.2 % (ref 39.0–52.0)
Hemoglobin: 15.7 g/dL (ref 13.0–17.0)
Immature Granulocytes: 0 %
Lymphocytes Relative: 26 %
Lymphs Abs: 1.9 10*3/uL (ref 0.7–4.0)
MCH: 29.6 pg (ref 26.0–34.0)
MCHC: 35.5 g/dL (ref 30.0–36.0)
MCV: 83.2 fL (ref 80.0–100.0)
Monocytes Absolute: 0.7 10*3/uL (ref 0.1–1.0)
Monocytes Relative: 9 %
Neutro Abs: 4.4 10*3/uL (ref 1.7–7.7)
Neutrophils Relative %: 63 %
Platelet Count: 192 10*3/uL (ref 150–400)
RBC: 5.31 MIL/uL (ref 4.22–5.81)
RDW: 13.5 % (ref 11.5–15.5)
WBC Count: 7.2 10*3/uL (ref 4.0–10.5)
nRBC: 0 % (ref 0.0–0.2)

## 2023-02-09 NOTE — Progress Notes (Signed)
[facility] [phone]   [phone]  PROGRESS NOTE  Patient Care Team: Darrow Bussing, MD as PCP - General (Family Medicine) Lewayne Bunting, MD as PCP - Cardiology (Cardiology)  Hematological/Oncological History # Dural Venous Sinus Thrombosis # Prior History of VTE 2017: Provoked RLE DVT after ankle fracture 08/23/2019: [place] lower extremity showed nonocclusive chronic DVT/wall thickening involving the distal right femoral vein and right popliteal veins 07/18/2021: CT Head WO contrast showed asymmetric hyperdensity of the right transverse and sigmoid dural venous sinuses, highly suspicious for dural venous sinus thrombosis. Extensive thrombosis of right transverse sinus confirmed by MRI brain and MR venogram of the head. Started on eliquis therapy.  08/05/2021: establish care with Dr. Leonides Schanz.  Recommend indefinite anticoagulation therapy. Continue eliquis 5 mg BID.   Interval History:  Shane Martin 47 y.o. 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 The patient's last visit was on 08/03/2022. In the interim since the last visit Shane Martin has continued on his Eliquis therapy without difficulty.  On exam today Shane Martin reports he has been well overall in the interim since her last visit.  His wife gave birth to a baby girl and she is now 57 weeks old.  He reports that he is not sleeping as well because the baby is not sleeping through the night yet.  He notes that overall his health has been steady.  About 2 to 3 months ago he had 2 to 4 weeks of vertigo and was evaluated by our neurologist.  He underwent MRI of the brain which showed no abnormalities.  Fortunately this resolved on its own.  He was prescribed meclizine but did not think it helped.  He reports that he continues his Eliquis 5 mg twice daily and does not have any difficulty with the cost.  He is not have any  bleeding, bruising, or dark stools.Marland Kitchen  He otherwise denies any fevers, chills, sweats, nausea, or diarrhea.  He denies any signs or symptoms concerning recurrent VTE such as leg swelling, pain in the leg, shortness of breath, chest pain, or headache.  Full 10 point ROS is listed below.  On a personal note his baby girl is now 48 weeks old.  MEDICAL HISTORY:  Past Medical History:  Diagnosis Date   Anxiety    Asthma    Dural venous sinus thrombosis    DVT (deep venous thrombosis) (HCC)    Elevated LFTs    Prothrombin gene mutation (HCC)    Vertigo     SURGICAL HISTORY: Past Surgical History:  Procedure Laterality Date   No prior surgery      SOCIAL HISTORY: Social History   Socioeconomic History   Marital status: Married    Spouse name: Not on file   Number of children: Not on file   Years of education: Not on file   Highest education level: Not on file  Occupational History   Occupation: Field seismologist    Comment: [employer]  Tobacco Use   Smoking status: Never    Passive exposure: Never   Smokeless tobacco: Never  Vaping Use   Vaping Use: Never used  Substance and Sexual Activity   Alcohol use: No   Drug use: No   Sexual activity: Not on file  Other Topics Concern   Not on file  Social History Narrative   Not on file   Social Determinants of Health   Financial  Resource Strain: Not on file  Food Insecurity: Not on file  Transportation Needs: Not on file  Physical Activity: Not on file  Stress: Not on file  Social Connections: Not on file  Intimate Partner Violence: Not on file    FAMILY HISTORY: Family History  Problem Relation Age of Onset   CAD Father 78    ALLERGIES:  has no active allergies.  MEDICATIONS:  Current Outpatient Medications  Medication Sig Dispense Refill   albuterol (VENTOLIN HFA) 108 (90 Base) MCG/ACT inhaler Inhale 2 puffs into the lungs every 6 (six) hours as needed (excercise induced asthma). (Patient not taking: Reported on  10/17/2022)     atorvastatin (LIPITOR) 20 MG tablet Take 1 tablet (20 mg total) by mouth daily. 90 tablet 3   ELIQUIS 5 MG TABS tablet TAKE 1 TABLET(5 MG) BY MOUTH TWICE DAILY 180 tablet 1   Meclizine HCl 25 MG CHEW Chew 1 tablet (25 mg total) by mouth 2 (two) times daily as needed. 30 tablet 0   No current facility-administered medications for this visit.    REVIEW OF SYSTEMS:   Constitutional: ( - ) fevers, ( - )  chills , ( - ) night sweats Eyes: ( - ) blurriness of vision, ( - ) double vision, ( - ) watery eyes Ears, nose, mouth, throat, and face: ( - ) mucositis, ( - ) sore throat Respiratory: ( - ) cough, ( - ) dyspnea, ( - ) wheezes Cardiovascular: ( - ) palpitation, ( - ) chest discomfort, ( - ) lower extremity swelling Gastrointestinal:  ( - ) nausea, ( - ) heartburn, ( - ) change in bowel habits Skin: ( - ) abnormal skin rashes Lymphatics: ( - ) new lymphadenopathy, ( - ) easy bruising Neurological: ( - ) numbness, ( - ) tingling, ( - ) new weaknesses Behavioral/Psych: ( - ) mood change, ( - ) new changes  All other systems were reviewed with the patient and are negative.  PHYSICAL EXAMINATION:  Vitals:   02/09/23 0943  BP: 127/78  Pulse: 69  Resp: 18  Temp: 98.3 F (36.8 C)  SpO2: 97%    Filed Weights   02/09/23 0943  Weight: 201 lb 5 oz (91.3 kg)     GENERAL: well appearing middle aged Caucasian male, alert, no distress and comfortable SKIN: skin color, texture, turgor are normal, no rashes or significant lesions EYES: conjunctiva are pink and non-injected, sclera clear LUNGS: clear to auscultation and percussion with normal breathing effort HEART: regular rate & rhythm and no murmurs and no lower extremity edema Musculoskeletal: no cyanosis of digits and no clubbing  PSYCH: alert & oriented x 3, fluent speech NEURO: no focal motor/sensory deficits  LABORATORY DATA:  I have reviewed the data as listed    Latest Ref Rng & Units 02/09/2023    9:22 AM  08/03/2022    1:59 PM 04/26/2022    8:34 AM  CBC  WBC 4.0 - 10.5 K/uL 7.2  8.9  8.0   Hemoglobin 13.0 - 17.0 g/dL 13.0  86.5  78.4   Hematocrit 39.0 - 52.0 % 44.2  43.3  44.7   Platelets 150 - 400 K/uL 192  213  204        Latest Ref Rng & Units 02/09/2023    9:22 AM 08/03/2022    1:59 PM 06/29/2022    8:19 AM  CMP  Glucose 70 - 99 mg/dL 696  295    BUN 6 - 20  mg/dL 17  20    Creatinine 7.82 - 1.24 mg/dL 9.56  2.13    Sodium 086 - 145 mmol/L 139  141    Potassium 3.5 - 5.1 mmol/L 4.1  4.3    Chloride 98 - 111 mmol/L 105  106    CO2 22 - 32 mmol/L 29  29    Calcium 8.9 - 10.3 mg/dL 9.0  9.0    Total Protein 6.5 - 8.1 g/dL 6.8  6.9  6.5   Total Bilirubin 0.3 - 1.2 mg/dL 0.9  1.0  0.8   Alkaline Phos 38 - 126 U/L 61  63  74   AST 15 - 41 U/L 31  43  38   ALT 0 - 44 U/L 51  74  65     RADIOGRAPHIC STUDIES: No results found.  ASSESSMENT & PLAN Shane Martin 47 y.o. male with medical history significant for prior DVT in 2017 who presents for evaluation of a dural venous sinus thrombosis.  After review of the labs, review of the records, and discussion with the patient the patients findings are most consistent with a provoked right lower extremity DVT and a dural venous sinus thrombosis.  Given his history of easy clotting I would recommend indefinite anticoagulation.  Typically the recommendation would be for limited duration of therapy for dural venous sinus thrombosis, but given his prior history of VTE I would recommend lifelong duration of therapy.  It appears he has quite prone to clotting.  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  Shane Martin and his wife voiced understanding of this plan moving forward.  We will continue with Eliquis 5 mg twice daily p.o.  # Dural Venous Sinus Thrombosis # Prior History of VTE # Heterozygous Prothrombin Gene Mutation  --  Agree with continued Eliquis 5 mg p.o. twice daily.   -- Patient does not wish to transition to maintenance of Eliquis 2.5 mg twice daily.  This was discussed previously --Given his prior episode of VTE and this new unusual thrombosis strongly recommend indefinite anticoagulation --prior hypercoagulable work-up included antiphospholipid antibody syndrome, protein C, protein S deficiencies, as well as heritable coagulopathies. He was noted to be heterozygous for prothrombin gene mutation. We discussed the modest increase risk in thrombosis with this condition.  --Labs today show white blood cell count 7.2, hemoglobin 15.7, MCV 83.2, and platelets of 192 --We will plan to see the patient back in approximately 6 months time.  No orders of the defined types were placed in this encounter.  All questions were answered. The patient knows to call the clinic with any problems, questions or concerns.  A total of more than 30 minutes were spent on this encounter with face-to-face time and non-face-to-face time, including preparing to see the patient, ordering tests and/or medications, counseling the patient and coordination of care as outlined above.   Ulysees Barns, MD Department of Hematology/Oncology [facility] Phone: [phone] Pager: [phone] Email: [email or web address]  02/09/2023 10:35 AM

## 2023-03-07 ENCOUNTER — Other Ambulatory Visit: Payer: Self-pay | Admitting: Hematology and Oncology

## 2023-06-06 ENCOUNTER — Other Ambulatory Visit: Payer: Self-pay | Admitting: Hematology and Oncology

## 2023-08-07 ENCOUNTER — Telehealth: Payer: Self-pay | Admitting: Hematology and Oncology

## 2023-08-07 NOTE — Telephone Encounter (Signed)
Rescheduled appointments per room/resource. Left VM with appointment details.

## 2023-08-08 NOTE — Progress Notes (Deleted)
[facility] OFFICE PROGRESS NOTE  Koirala, Dibas, MD [address] [place] [address]  DIAGNOSIS:  # Dural Venous Sinus Thrombosis # Prior History of VTE 2017: Provoked RLE DVT after ankle fracture 08/23/2019: [place] lower extremity showed nonocclusive chronic DVT/wall thickening involving the distal right femoral vein and right popliteal veins 07/18/2021: CT Head WO contrast showed asymmetric hyperdensity of the right transverse and sigmoid dural venous sinuses, highly suspicious for dural venous sinus thrombosis. Extensive thrombosis of right transverse sinus confirmed by MRI brain and MR venogram of the head. Started on eliquis therapy.  08/05/2021: establish care with Dr. Leonides Schanz.  Recommend indefinite anticoagulation therapy. Continue eliquis 5 mg BID.   INTERVAL HISTORY: Shane Martin 47 y.o. male returns to clinic for 28-month follow-up visit.  The patient follows with Dr. Leonides Schanz for history of recurrent VTE and dural venous sinus thrombosis.  The patient was last seen on 02/09/2023.  Dr. Leonides Schanz recommended indefinite anticoagulation.  Patient is currently on Eliquis and is tolerating this well without any concerning abnormal bleeding or bruising.  He denies any major changes in his health since he was last seen.  Sees neurology for vertigo?  He denies any headache, visual changes, falls, seizures, extremity weakness, or speech changes.  He denies any leg swelling, erythema, or calf pain.  He denies any unusual fever, chills, night sweats, diarrhea, or nausea.  He denies any shortness of breath, chest pain, or lightheadedness.  MEDICAL HISTORY: Past Medical History:  Diagnosis Date   Anxiety    Asthma    Dural venous sinus thrombosis    DVT (deep venous thrombosis) (HCC)    Elevated LFTs    Prothrombin gene mutation (HCC)    Vertigo     ALLERGIES:  has no active allergies.  MEDICATIONS:  Current Outpatient Medications  Medication Sig Dispense  Refill   albuterol (VENTOLIN HFA) 108 (90 Base) MCG/ACT inhaler Inhale 2 puffs into the lungs every 6 (six) hours as needed (excercise induced asthma). (Patient not taking: Reported on 10/17/2022)     atorvastatin (LIPITOR) 20 MG tablet Take 1 tablet (20 mg total) by mouth daily. 90 tablet 3   ELIQUIS 5 MG TABS tablet TAKE 1 TABLET(5 MG) BY MOUTH TWICE DAILY 180 tablet 1   Meclizine HCl 25 MG CHEW Chew 1 tablet (25 mg total) by mouth 2 (two) times daily as needed. 30 tablet 0   No current facility-administered medications for this visit.    SURGICAL HISTORY:  Past Surgical History:  Procedure Laterality Date   No prior surgery      REVIEW OF SYSTEMS:   Review of Systems  Constitutional: Negative for appetite change, chills, fatigue, fever and unexpected weight change.  HENT:   Negative for mouth sores, nosebleeds, sore throat and trouble swallowing.   Eyes: Negative for eye problems and icterus.  Respiratory: Negative for cough, hemoptysis, shortness of breath and wheezing.   Cardiovascular: Negative for chest pain and leg swelling.  Gastrointestinal: Negative for abdominal pain, constipation, diarrhea, nausea and vomiting.  Genitourinary: Negative for bladder incontinence, difficulty urinating, dysuria, frequency and hematuria.   Musculoskeletal: Negative for back pain, gait problem, neck pain and neck stiffness.  Skin: Negative for itching and rash.  Neurological: Negative for dizziness, extremity weakness, gait problem, headaches, light-headedness and seizures.  Hematological: Negative for adenopathy. Does not bruise/bleed easily.  Psychiatric/Behavioral: Negative for confusion, depression and sleep disturbance. The patient is not nervous/anxious.     PHYSICAL EXAMINATION:  There were  no vitals taken for this visit.  ECOG PERFORMANCE STATUS: {CHL ONC ECOG [id number]  Physical Exam  Constitutional: Oriented to person, place, and time and well-developed, well-nourished, and  in no distress. No distress.  HENT:  Head: Normocephalic and atraumatic.  Mouth/Throat: Oropharynx is clear and moist. No oropharyngeal exudate.  Eyes: Conjunctivae are normal. Right eye exhibits no discharge. Left eye exhibits no discharge. No scleral icterus.  Neck: Normal range of motion. Neck supple.  Cardiovascular: Normal rate, regular rhythm, normal heart sounds and intact distal pulses.   Pulmonary/Chest: Effort normal and breath sounds normal. No respiratory distress. No wheezes. No rales.  Abdominal: Soft. Bowel sounds are normal. Exhibits no distension and no mass. There is no tenderness.  Musculoskeletal: Normal range of motion. Exhibits no edema.  Lymphadenopathy:    No cervical adenopathy.  Neurological: Alert and oriented to person, place, and time. Exhibits normal muscle tone. Gait normal. Coordination normal.  Skin: Skin is warm and dry. No rash noted. Not diaphoretic. No erythema. No pallor.  Psychiatric: Mood, memory and judgment normal.  Vitals reviewed.  LABORATORY DATA: Lab Results  Component Value Date   WBC 7.2 02/09/2023   HGB 15.7 02/09/2023   HCT 44.2 02/09/2023   MCV 83.2 02/09/2023   PLT 192 02/09/2023      Chemistry      Component Value Date/Time   NA 139 02/09/2023 0922   NA 140 11/29/2021 0815   K 4.1 02/09/2023 0922   CL 105 02/09/2023 0922   CO2 29 02/09/2023 0922   BUN 17 02/09/2023 0922   BUN 23 11/29/2021 0815   CREATININE 0.91 02/09/2023 0922      Component Value Date/Time   CALCIUM 9.0 02/09/2023 0922   ALKPHOS 61 02/09/2023 0922   AST 31 02/09/2023 0922   ALT 51 (H) 02/09/2023 0922   BILITOT 0.9 02/09/2023 0922       RADIOGRAPHIC STUDIES:  No results found.   ASSESSMENT/PLAN:  Shane Martin 47 y.o. male with medical history significant for prior DVT in 2017 who presents for evaluation of a dural venous sinus thrombosis.   Dr. Leonides Schanz previously reviewed his labs, review of the records, and discussion with the patient the  patients findings are most consistent with a ***provoked right lower extremity DVT and a dural venous sinus thrombosis.  Given his history of easy clotting Dr. Leonides Schanz would recommend indefinite anticoagulation.   Typically the recommendation would be for limited duration of therapy for dural venous sinus thrombosis, but given his prior history of VTE Dr. Leonides Schanz would recommend lifelong duration of therapy.  It appears he has quite prone to clotting.  He does have a heterozygous prothrombin gene mutation which does modestly increased risk of blood clots but Dr. Leonides Schanz does not believe is responsible for his current findings.  Mr. Tonny Bollman and his wife voiced understanding of this plan moving forward.  We will continue with Eliquis 5 mg twice daily p.o.   # Dural Venous Sinus Thrombosis # Prior History of VTE # Heterozygous Prothrombin Gene Mutation  -- Agree with continued Eliquis 5 mg p.o. twice daily.   -- Patient does not wish to transition to maintenance of Eliquis 2.5 mg twice daily.  This was discussed previously --Given his prior episode of VTE and this new unusual thrombosis strongly recommend indefinite anticoagulation --prior hypercoagulable work-up included antiphospholipid antibody syndrome, protein C, protein S deficiencies, as well as heritable coagulopathies. He was noted to be heterozygous for prothrombin gene mutation. We discussed the modest  increase risk in thrombosis with this condition.  --Labs today show white blood cell count ***, hemoglobin ***, MCV ***, and platelets of *** --We will plan to see the patient back in approximately 6 months time.    No orders of the defined types were placed in this encounter.    I spent {CHL ONC TIME VISIT - [id number]} counseling the patient face to face. The total time spent in the appointment was {CHL ONC TIME VISIT - [id number]}.  Anfernee Peschke L Kathelyn Gombos, PA-C 08/08/23

## 2023-08-09 ENCOUNTER — Telehealth: Payer: Self-pay | Admitting: Hematology and Oncology

## 2023-08-11 ENCOUNTER — Ambulatory Visit: Payer: BC Managed Care – PPO | Admitting: Physician Assistant

## 2023-08-11 ENCOUNTER — Other Ambulatory Visit: Payer: BC Managed Care – PPO

## 2023-08-23 ENCOUNTER — Telehealth: Payer: Self-pay | Admitting: *Deleted

## 2023-08-23 NOTE — Telephone Encounter (Signed)
Received call from pt. He states he has a sore throat -he got sick from his young daughter who just started daycare. As he is on Eliquis,  advised he should not take Ibuprofen, Advised he could take Tylenol without any problems, rinse /gargle warm salt water and throat lozenges can help as well. Pt voiced understanding. No other questions or concerns.

## 2023-08-30 DIAGNOSIS — J069 Acute upper respiratory infection, unspecified: Secondary | ICD-10-CM | POA: Diagnosis not present

## 2023-09-04 DIAGNOSIS — Z0000 Encounter for general adult medical examination without abnormal findings: Secondary | ICD-10-CM | POA: Diagnosis not present

## 2023-09-06 DIAGNOSIS — E7800 Pure hypercholesterolemia, unspecified: Secondary | ICD-10-CM | POA: Diagnosis not present

## 2023-09-06 DIAGNOSIS — Z125 Encounter for screening for malignant neoplasm of prostate: Secondary | ICD-10-CM | POA: Diagnosis not present

## 2023-09-06 DIAGNOSIS — Z131 Encounter for screening for diabetes mellitus: Secondary | ICD-10-CM | POA: Diagnosis not present

## 2023-09-17 ENCOUNTER — Other Ambulatory Visit: Payer: Self-pay | Admitting: Internal Medicine

## 2023-09-25 ENCOUNTER — Telehealth: Payer: Self-pay | Admitting: Hematology and Oncology

## 2023-09-29 ENCOUNTER — Other Ambulatory Visit: Payer: BC Managed Care – PPO

## 2023-09-29 ENCOUNTER — Ambulatory Visit: Payer: BC Managed Care – PPO | Admitting: Hematology and Oncology

## 2023-10-08 ENCOUNTER — Other Ambulatory Visit: Payer: Self-pay | Admitting: Hematology and Oncology

## 2023-10-08 DIAGNOSIS — G08 Intracranial and intraspinal phlebitis and thrombophlebitis: Secondary | ICD-10-CM

## 2023-10-08 NOTE — Progress Notes (Unsigned)
[facility] [phone]   Fax:(336) [phone]  PROGRESS NOTE  Patient Care Team: Darrow Bussing, MD as PCP - General (Family Medicine) Lewayne Bunting, MD as PCP - Cardiology (Cardiology)  Hematological/Oncological History # Dural Venous Sinus Thrombosis # Prior History of VTE 2017: Provoked RLE DVT after ankle fracture 08/23/2019: [place] lower extremity showed nonocclusive chronic DVT/wall thickening involving the distal right femoral vein and right popliteal veins 07/18/2021: CT Head WO contrast showed asymmetric hyperdensity of the right transverse and sigmoid dural venous sinuses, highly suspicious for dural venous sinus thrombosis. Extensive thrombosis of right transverse sinus confirmed by MRI brain and MR venogram of the head. Started on eliquis therapy.  08/05/2021: establish care with Dr. Leonides Schanz.  Recommend indefinite anticoagulation therapy. Continue eliquis 5 mg BID.   Interval History:  Shane Martin 48 y.o. 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 The patient's last visit was on 02/09/2023. In the interim since the last visit Shane Martin has continued on his Eliquis therapy without difficulty.  On exam today Shane Martin reports *** He denies any signs or symptoms concerning recurrent VTE such as leg swelling, pain in the leg, shortness of breath, chest pain, or headache.  Full 10 point ROS is listed below.  On a personal note his baby girl is now 3 weeks old.  MEDICAL HISTORY:  Past Medical History:  Diagnosis Date   Anxiety    Asthma    Dural venous sinus thrombosis    DVT (deep venous thrombosis) (HCC)    Elevated LFTs    Prothrombin gene mutation (HCC)    Vertigo     SURGICAL HISTORY: Past Surgical History:  Procedure Laterality Date   No prior surgery      SOCIAL HISTORY: Social History   Socioeconomic History   Marital status: Married    Spouse  name: Not on file   Number of children: Not on file   Years of education: Not on file   Highest education level: Not on file  Occupational History   Occupation: Field seismologist    Comment: [employer]  Tobacco Use   Smoking status: Never    Passive exposure: Never   Smokeless tobacco: Never  Vaping Use   Vaping status: Never Used  Substance and Sexual Activity   Alcohol use: No   Drug use: No   Sexual activity: Not on file  Other Topics Concern   Not on file  Social History Narrative   Not on file   Social Drivers of Health   Financial Resource Strain: Not on file  Food Insecurity: Not on file  Transportation Needs: Not on file  Physical Activity: Not on file  Stress: Not on file  Social Connections: Not on file  Intimate Partner Violence: Not on file    FAMILY HISTORY: Family History  Problem Relation Age of Onset   CAD Father 72    ALLERGIES:  has no active allergies.  MEDICATIONS:  Current Outpatient Medications  Medication Sig Dispense Refill   albuterol (VENTOLIN HFA) 108 (90 Base) MCG/ACT inhaler Inhale 2 puffs into the lungs every 6 (six) hours as needed (excercise induced asthma). (Patient not taking: Reported on 10/17/2022)     atorvastatin (LIPITOR) 20 MG tablet TAKE 1 TABLET(20 MG) BY MOUTH DAILY 90 tablet 3   ELIQUIS 5 MG TABS tablet TAKE 1 TABLET(5 MG) BY MOUTH TWICE DAILY 180 tablet 1   Meclizine HCl  25 MG CHEW Chew 1 tablet (25 mg total) by mouth 2 (two) times daily as needed. 30 tablet 0   No current facility-administered medications for this visit.    REVIEW OF SYSTEMS:   Constitutional: ( - ) fevers, ( - )  chills , ( - ) night sweats Eyes: ( - ) blurriness of vision, ( - ) double vision, ( - ) watery eyes Ears, nose, mouth, throat, and face: ( - ) mucositis, ( - ) sore throat Respiratory: ( - ) cough, ( - ) dyspnea, ( - ) wheezes Cardiovascular: ( - ) palpitation, ( - ) chest discomfort, ( - ) lower extremity swelling Gastrointestinal:  ( - )  nausea, ( - ) heartburn, ( - ) change in bowel habits Skin: ( - ) abnormal skin rashes Lymphatics: ( - ) new lymphadenopathy, ( - ) easy bruising Neurological: ( - ) numbness, ( - ) tingling, ( - ) new weaknesses Behavioral/Psych: ( - ) mood change, ( - ) new changes  All other systems were reviewed with the patient and are negative.  PHYSICAL EXAMINATION:  There were no vitals filed for this visit.   There were no vitals filed for this visit.    GENERAL: well appearing middle aged Caucasian male, alert, no distress and comfortable SKIN: skin color, texture, turgor are normal, no rashes or significant lesions EYES: conjunctiva are pink and non-injected, sclera clear LUNGS: clear to auscultation and percussion with normal breathing effort HEART: regular rate & rhythm and no murmurs and no lower extremity edema Musculoskeletal: no cyanosis of digits and no clubbing  PSYCH: alert & oriented x 3, fluent speech NEURO: no focal motor/sensory deficits  LABORATORY DATA:  I have reviewed the data as listed    Latest Ref Rng & Units 02/09/2023    9:22 AM 08/03/2022    1:59 PM 04/26/2022    8:34 AM  CBC  WBC 4.0 - 10.5 K/uL 7.2  8.9  8.0   Hemoglobin 13.0 - 17.0 g/dL 16.1  09.6  04.5   Hematocrit 39.0 - 52.0 % 44.2  43.3  44.7   Platelets 150 - 400 K/uL 192  213  204        Latest Ref Rng & Units 02/09/2023    9:22 AM 08/03/2022    1:59 PM 06/29/2022    8:19 AM  CMP  Glucose 70 - 99 mg/dL 409  811    BUN 6 - 20 mg/dL 17  20    Creatinine 9.14 - 1.24 mg/dL 7.82  9.56    Sodium 213 - 145 mmol/L 139  141    Potassium 3.5 - 5.1 mmol/L 4.1  4.3    Chloride 98 - 111 mmol/L 105  106    CO2 22 - 32 mmol/L 29  29    Calcium 8.9 - 10.3 mg/dL 9.0  9.0    Total Protein 6.5 - 8.1 g/dL 6.8  6.9  6.5   Total Bilirubin 0.3 - 1.2 mg/dL 0.9  1.0  0.8   Alkaline Phos 38 - 126 U/L 61  63  74   AST 15 - 41 U/L 31  43  38   ALT 0 - 44 U/L 51  74  65     RADIOGRAPHIC STUDIES: No results  found.  ASSESSMENT & PLAN Shane Martin 48 y.o. male with medical history significant for prior DVT in 2017 who presents for evaluation of a dural venous sinus thrombosis.  After review of the  labs, review of the records, and discussion with the patient the patients findings are most consistent with a provoked right lower extremity DVT and a dural venous sinus thrombosis.  Given his history of easy clotting I would recommend indefinite anticoagulation.  Typically the recommendation would be for limited duration of therapy for dural venous sinus thrombosis, but given his prior history of VTE I would recommend lifelong duration of therapy.  It appears he has quite prone to clotting.  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  Shane Martin and his wife voiced understanding of this plan moving forward.  We will continue with Eliquis 5 mg twice daily p.o.  # Dural Venous Sinus Thrombosis # Prior History of VTE # Heterozygous Prothrombin Gene Mutation  -- Agree with continued Eliquis 5 mg p.o. twice daily.   -- Patient does not wish to transition to maintenance of Eliquis 2.5 mg twice daily.  This was discussed previously --Given his prior episode of VTE and this new unusual thrombosis strongly recommend indefinite anticoagulation --prior hypercoagulable work-up included antiphospholipid antibody syndrome, protein C, protein S deficiencies, as well as heritable coagulopathies. He was noted to be heterozygous for prothrombin gene mutation. We discussed the modest increase risk in thrombosis with this condition.  --Labs today show white blood cell count *** --We will plan to see the patient back in approximately 6 months time.  No orders of the defined types were placed in this encounter.  All questions were answered. The patient knows to call the clinic with any problems, questions or  concerns.  A total of more than 30 minutes were spent on this encounter with face-to-face time and non-face-to-face time, including preparing to see the patient, ordering tests and/or medications, counseling the patient and coordination of care as outlined above.   Ulysees Barns, MD Department of Hematology/Oncology [facility] at [facility] Phone: [phone] Pager: [phone] Email: [email or web address]  10/08/2023 2:02 PM

## 2023-10-09 ENCOUNTER — Telehealth: Payer: Self-pay | Admitting: Hematology and Oncology

## 2023-10-09 ENCOUNTER — Other Ambulatory Visit: Payer: BC Managed Care – PPO

## 2023-10-09 ENCOUNTER — Ambulatory Visit: Payer: BC Managed Care – PPO | Admitting: Hematology and Oncology

## 2023-10-09 DIAGNOSIS — Z7901 Long term (current) use of anticoagulants: Secondary | ICD-10-CM

## 2023-10-09 DIAGNOSIS — G08 Intracranial and intraspinal phlebitis and thrombophlebitis: Secondary | ICD-10-CM

## 2023-10-09 DIAGNOSIS — Z86718 Personal history of other venous thrombosis and embolism: Secondary | ICD-10-CM

## 2023-10-09 NOTE — Telephone Encounter (Signed)
Rescheduled per voicemail. Patient is aware of the rescheduled appointments and is active on MyChart.

## 2023-10-20 ENCOUNTER — Inpatient Hospital Stay: Payer: 59 | Attending: Hematology and Oncology

## 2023-10-20 ENCOUNTER — Inpatient Hospital Stay (HOSPITAL_BASED_OUTPATIENT_CLINIC_OR_DEPARTMENT_OTHER): Payer: 59 | Admitting: Hematology and Oncology

## 2023-10-20 VITALS — BP 127/85 | HR 70 | Temp 98.1°F | Resp 18 | Wt 201.7 lb

## 2023-10-20 DIAGNOSIS — G08 Intracranial and intraspinal phlebitis and thrombophlebitis: Secondary | ICD-10-CM

## 2023-10-20 DIAGNOSIS — Z86718 Personal history of other venous thrombosis and embolism: Secondary | ICD-10-CM | POA: Insufficient documentation

## 2023-10-20 DIAGNOSIS — D6852 Prothrombin gene mutation: Secondary | ICD-10-CM | POA: Diagnosis not present

## 2023-10-20 DIAGNOSIS — Z7901 Long term (current) use of anticoagulants: Secondary | ICD-10-CM

## 2023-10-20 DIAGNOSIS — I676 Nonpyogenic thrombosis of intracranial venous system: Secondary | ICD-10-CM | POA: Insufficient documentation

## 2023-10-20 LAB — CMP (CANCER CENTER ONLY)
ALT: 56 U/L — ABNORMAL HIGH (ref 0–44)
AST: 30 U/L (ref 15–41)
Albumin: 4.2 g/dL (ref 3.5–5.0)
Alkaline Phosphatase: 62 U/L (ref 38–126)
Anion gap: 4 — ABNORMAL LOW (ref 5–15)
BUN: 21 mg/dL — ABNORMAL HIGH (ref 6–20)
CO2: 30 mmol/L (ref 22–32)
Calcium: 9 mg/dL (ref 8.9–10.3)
Chloride: 104 mmol/L (ref 98–111)
Creatinine: 0.9 mg/dL (ref 0.61–1.24)
GFR, Estimated: >60 mL/min (ref >60)
Glucose, Bld: 170 mg/dL — ABNORMAL HIGH (ref 70–99)
Potassium: 3.9 mmol/L (ref 3.5–5.1)
Sodium: 138 mmol/L (ref 135–145)
Total Bilirubin: 0.6 mg/dL (ref 0.0–1.2)
Total Protein: 6.8 g/dL (ref 6.5–8.1)

## 2023-10-20 LAB — CBC WITH DIFFERENTIAL (CANCER CENTER ONLY)
Abs Immature Granulocytes: 0.05 10*3/uL (ref 0.00–0.07)
Basophils Absolute: 0.1 10*3/uL (ref 0.0–0.1)
Basophils Relative: 1 %
Eosinophils Absolute: 0.2 10*3/uL (ref 0.0–0.5)
Eosinophils Relative: 2 %
HCT: 42.6 % (ref 39.0–52.0)
Hemoglobin: 14.9 g/dL (ref 13.0–17.0)
Immature Granulocytes: 1 %
Lymphocytes Relative: 28 %
Lymphs Abs: 2.5 10*3/uL (ref 0.7–4.0)
MCH: 29 pg (ref 26.0–34.0)
MCHC: 35 g/dL (ref 30.0–36.0)
MCV: 83 fL (ref 80.0–100.0)
Monocytes Absolute: 0.6 10*3/uL (ref 0.1–1.0)
Monocytes Relative: 7 %
Neutro Abs: 5.6 10*3/uL (ref 1.7–7.7)
Neutrophils Relative %: 61 %
Platelet Count: 213 10*3/uL (ref 150–400)
RBC: 5.13 MIL/uL (ref 4.22–5.81)
RDW: 13.8 % (ref 11.5–15.5)
WBC Count: 9 10*3/uL (ref 4.0–10.5)
nRBC: 0 % (ref 0.0–0.2)

## 2023-10-20 NOTE — Progress Notes (Signed)
[facility] Telephone:(336) [phone]   Fax:(336) [phone]  PROGRESS NOTE  Patient Care Team: Darrow Bussing, MD as PCP - General (Family Medicine) Lewayne Bunting, MD as PCP - Cardiology (Cardiology)  Hematological/Oncological History # Dural Venous Sinus Thrombosis # Prior History of VTE 2017: Provoked RLE DVT after ankle fracture 08/23/2019: [place] lower extremity showed nonocclusive chronic DVT/wall thickening involving the distal right femoral vein and right popliteal veins 07/18/2021: CT Head WO contrast showed asymmetric hyperdensity of the right transverse and sigmoid dural venous sinuses, highly suspicious for dural venous sinus thrombosis. Extensive thrombosis of right transverse sinus confirmed by MRI brain and MR venogram of the head. Started on eliquis therapy.  08/05/2021: establish care with Dr. Leonides Schanz.  Recommend indefinite anticoagulation therapy. Continue eliquis 5 mg BID.   Interval History:  Shane Martin 48 y.o. 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 The patient's last visit was on 02/09/2024. In the interim since the last visit Shane Martin has continued on his Eliquis therapy without difficulty.  On exam today Mr. Widmayer reports his child is 75 months old and now in daycare.  He has been developing colds periodically such as runny nose and sore throat as well as cough.  He reports he did have a long drawnout cough but that his symptoms resolved.  He notes that he is currently taking Eliquis 5 mg twice daily as prescribed.  He is having no trouble with the medication.  He denies any bleeding, bruising, or dark stools.  He reports that he is unsure what the cost of medication will be now because he has a new insurance and a new job.  He reports that he is not having any signs or symptoms concerning for recurrent VTE at this time.  Overall he is had no changes in his health and is  willing and able to continue with Eliquis therapy at this time.  He otherwise denies any fevers, chills, sweats, nausea, or diarrhea.  Full 10 point ROS is listed below.  On a personal note his baby girl is now 92 weeks old.  MEDICAL HISTORY:  Past Medical History:  Diagnosis Date   Anxiety    Asthma    Dural venous sinus thrombosis    DVT (deep venous thrombosis) (HCC)    Elevated LFTs    Prothrombin gene mutation (HCC)    Vertigo     SURGICAL HISTORY: Past Surgical History:  Procedure Laterality Date   No prior surgery      SOCIAL HISTORY: Social History   Socioeconomic History   Marital status: Married    Spouse name: Not on file   Number of children: Not on file   Years of education: Not on file   Highest education level: Not on file  Occupational History   Occupation: Field seismologist    Comment: [employer]  Tobacco Use   Smoking status: Never    Passive exposure: Never   Smokeless tobacco: Never  Vaping Use   Vaping status: Never Used  Substance and Sexual Activity   Alcohol use: No   Drug use: No   Sexual activity: Not on file  Other Topics Concern   Not on file  Social History Narrative   Not on file   Social Drivers of Health   Financial Resource Strain: Not on file  Food Insecurity: Not on file  Transportation Needs: Not on file  Physical Activity: Not on  file  Stress: Not on file  Social Connections: Not on file  Intimate Partner Violence: Not on file    FAMILY HISTORY: Family History  Problem Relation Age of Onset   CAD Father 88    ALLERGIES:  has no active allergies.  MEDICATIONS:  Current Outpatient Medications  Medication Sig Dispense Refill   albuterol (VENTOLIN HFA) 108 (90 Base) MCG/ACT inhaler Inhale 2 puffs into the lungs every 6 (six) hours as needed (excercise induced asthma). (Patient not taking: Reported on 10/17/2022)     atorvastatin (LIPITOR) 20 MG tablet TAKE 1 TABLET(20 MG) BY MOUTH DAILY 90 tablet 3   ELIQUIS 5 MG  TABS tablet TAKE 1 TABLET(5 MG) BY MOUTH TWICE DAILY 180 tablet 1   Meclizine HCl 25 MG CHEW Chew 1 tablet (25 mg total) by mouth 2 (two) times daily as needed. 30 tablet 0   No current facility-administered medications for this visit.    REVIEW OF SYSTEMS:   Constitutional: ( - ) fevers, ( - )  chills , ( - ) night sweats Eyes: ( - ) blurriness of vision, ( - ) double vision, ( - ) watery eyes Ears, nose, mouth, throat, and face: ( - ) mucositis, ( - ) sore throat Respiratory: ( - ) cough, ( - ) dyspnea, ( - ) wheezes Cardiovascular: ( - ) palpitation, ( - ) chest discomfort, ( - ) lower extremity swelling Gastrointestinal:  ( - ) nausea, ( - ) heartburn, ( - ) change in bowel habits Skin: ( - ) abnormal skin rashes Lymphatics: ( - ) new lymphadenopathy, ( - ) easy bruising Neurological: ( - ) numbness, ( - ) tingling, ( - ) new weaknesses Behavioral/Psych: ( - ) mood change, ( - ) new changes  All other systems were reviewed with the patient and are negative.  PHYSICAL EXAMINATION:  There were no vitals filed for this visit.   There were no vitals filed for this visit.    GENERAL: well appearing middle aged Caucasian male, alert, no distress and comfortable SKIN: skin color, texture, turgor are normal, no rashes or significant lesions EYES: conjunctiva are pink and non-injected, sclera clear LUNGS: clear to auscultation and percussion with normal breathing effort HEART: regular rate & rhythm and no murmurs and no lower extremity edema Musculoskeletal: no cyanosis of digits and no clubbing  PSYCH: alert & oriented x 3, fluent speech NEURO: no focal motor/sensory deficits  LABORATORY DATA:  I have reviewed the data as listed    Latest Ref Rng & Units 02/09/2023    9:22 AM 08/03/2022    1:59 PM 04/26/2022    8:34 AM  CBC  WBC 4.0 - 10.5 K/uL 7.2  8.9  8.0   Hemoglobin 13.0 - 17.0 g/dL 16.1  09.6  04.5   Hematocrit 39.0 - 52.0 % 44.2  43.3  44.7   Platelets 150 - 400 K/uL  192  213  204        Latest Ref Rng & Units 02/09/2023    9:22 AM 08/03/2022    1:59 PM 06/29/2022    8:19 AM  CMP  Glucose 70 - 99 mg/dL 409  811    BUN 6 - 20 mg/dL 17  20    Creatinine 9.14 - 1.24 mg/dL 7.82  9.56    Sodium 213 - 145 mmol/L 139  141    Potassium 3.5 - 5.1 mmol/L 4.1  4.3    Chloride 98 - 111 mmol/L 105  106    CO2 22 - 32 mmol/L 29  29    Calcium 8.9 - 10.3 mg/dL 9.0  9.0    Total Protein 6.5 - 8.1 g/dL 6.8  6.9  6.5   Total Bilirubin 0.3 - 1.2 mg/dL 0.9  1.0  0.8   Alkaline Phos 38 - 126 U/L 61  63  74   AST 15 - 41 U/L 31  43  38   ALT 0 - 44 U/L 51  74  65     RADIOGRAPHIC STUDIES: No results found.  ASSESSMENT & PLAN Shane Martin 48 y.o. male with medical history significant for prior DVT in 2017 who presents for evaluation of a dural venous sinus thrombosis.  After review of the labs, review of the records, and discussion with the patient the patients findings are most consistent with a provoked right lower extremity DVT and a dural venous sinus thrombosis.  Given his history of easy clotting I would recommend indefinite anticoagulation.  Typically the recommendation would be for limited duration of therapy for dural venous sinus thrombosis, but given his prior history of VTE I would recommend lifelong duration of therapy.  It appears he has quite prone to clotting.  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  Mr. Tonny Bollman and his wife voiced understanding of this plan moving forward.  We will continue with Eliquis 5 mg twice daily p.o.  # Dural Venous Sinus Thrombosis # Prior History of VTE # Heterozygous Prothrombin Gene Mutation  -- Agree with continued Eliquis 5 mg p.o. twice daily.   -- Patient does not wish to transition to maintenance of Eliquis 2.5 mg twice daily.  This was discussed previously --Given his prior episode of VTE and  this new unusual thrombosis strongly recommend indefinite anticoagulation --prior hypercoagulable work-up included antiphospholipid antibody syndrome, protein C, protein S deficiencies, as well as heritable coagulopathies. He was noted to be heterozygous for prothrombin gene mutation. We discussed the modest increase risk in thrombosis with this condition.  --Labs today show white blood cell count 9.0, hemoglobin 14.9, MCV 83.0, platelets 213 --We will plan to see the patient back in approximately 6 months time.  No orders of the defined types were placed in this encounter.  All questions were answered. The patient knows to call the clinic with any problems, questions or concerns.  A total of more than 25 minutes were spent on this encounter with face-to-face time and non-face-to-face time, including preparing to see the patient, ordering tests and/or medications, counseling the patient and coordination of care as outlined above.   Ulysees Barns, MD Department of Hematology/Oncology [facility] at [facility] Phone: [phone] Pager: [phone] Email: [email or web address]  10/20/2023 7:51 AM

## 2023-11-10 ENCOUNTER — Telehealth: Payer: Self-pay | Admitting: *Deleted

## 2023-11-10 NOTE — Telephone Encounter (Signed)
Received vm message from pt requesting a call back regarding new neck pain. TCT patient and spoke with him. He states he has slept on the sofa for the last 2 nights and developed a stiff/sore neck and shoulder on the left. He called to make sure it is not a another blood clot in the dural venous sinus like he has had before. Denies headache, visual changes. Reassured pt that is is very likely it is the sleeping on the sofa that has cased this discomfort. Advised warm compresses , easy neck and shoulder stretches. Advised that if he has any other s/s as listed above to call us back. Pt is in agreement. He figured it was sleeping on the sofa that caused the discomfort.

## 2024-02-29 ENCOUNTER — Other Ambulatory Visit: Payer: Self-pay | Admitting: Hematology and Oncology

## 2024-04-12 ENCOUNTER — Telehealth: Payer: Self-pay | Admitting: Hematology and Oncology

## 2024-04-15 ENCOUNTER — Other Ambulatory Visit: Payer: 59

## 2024-04-15 ENCOUNTER — Inpatient Hospital Stay: Payer: 59 | Admitting: Hematology and Oncology

## 2024-05-01 ENCOUNTER — Inpatient Hospital Stay (HOSPITAL_BASED_OUTPATIENT_CLINIC_OR_DEPARTMENT_OTHER): Admitting: Hematology and Oncology

## 2024-05-01 ENCOUNTER — Ambulatory Visit: Payer: Self-pay | Admitting: *Deleted

## 2024-05-01 ENCOUNTER — Other Ambulatory Visit: Payer: Self-pay | Admitting: Hematology and Oncology

## 2024-05-01 ENCOUNTER — Inpatient Hospital Stay: Attending: Hematology and Oncology

## 2024-05-01 VITALS — BP 118/71 | HR 65 | Temp 97.7°F | Resp 13 | Wt 194.4 lb

## 2024-05-01 DIAGNOSIS — Z7901 Long term (current) use of anticoagulants: Secondary | ICD-10-CM

## 2024-05-01 DIAGNOSIS — Z86718 Personal history of other venous thrombosis and embolism: Secondary | ICD-10-CM

## 2024-05-01 DIAGNOSIS — G08 Intracranial and intraspinal phlebitis and thrombophlebitis: Secondary | ICD-10-CM | POA: Insufficient documentation

## 2024-05-01 DIAGNOSIS — D6852 Prothrombin gene mutation: Secondary | ICD-10-CM | POA: Insufficient documentation

## 2024-05-01 LAB — CBC WITH DIFFERENTIAL (CANCER CENTER ONLY)
Abs Immature Granulocytes: 0.03 K/uL (ref 0.00–0.07)
Basophils Absolute: 0.1 K/uL (ref 0.0–0.1)
Basophils Relative: 1 %
Eosinophils Absolute: 0.1 K/uL (ref 0.0–0.5)
Eosinophils Relative: 2 %
HCT: 43.8 % (ref 39.0–52.0)
Hemoglobin: 15.8 g/dL (ref 13.0–17.0)
Immature Granulocytes: 0 %
Lymphocytes Relative: 30 %
Lymphs Abs: 2.2 K/uL (ref 0.7–4.0)
MCH: 29.3 pg (ref 26.0–34.0)
MCHC: 36.1 g/dL — ABNORMAL HIGH (ref 30.0–36.0)
MCV: 81.3 fL (ref 80.0–100.0)
Monocytes Absolute: 0.7 K/uL (ref 0.1–1.0)
Monocytes Relative: 10 %
Neutro Abs: 4 K/uL (ref 1.7–7.7)
Neutrophils Relative %: 57 %
Platelet Count: 190 K/uL (ref 150–400)
RBC: 5.39 MIL/uL (ref 4.22–5.81)
RDW: 13.6 % (ref 11.5–15.5)
WBC Count: 7.1 K/uL (ref 4.0–10.5)
nRBC: 0 % (ref 0.0–0.2)

## 2024-05-01 LAB — CMP (CANCER CENTER ONLY)
ALT: 39 U/L (ref 0–44)
AST: 26 U/L (ref 15–41)
Albumin: 4.3 g/dL (ref 3.5–5.0)
Alkaline Phosphatase: 63 U/L (ref 38–126)
Anion gap: 4 — ABNORMAL LOW (ref 5–15)
BUN: 20 mg/dL (ref 6–20)
CO2: 31 mmol/L (ref 22–32)
Calcium: 9 mg/dL (ref 8.9–10.3)
Chloride: 103 mmol/L (ref 98–111)
Creatinine: 0.93 mg/dL (ref 0.61–1.24)
GFR, Estimated: >60 mL/min (ref >60)
Glucose, Bld: 102 mg/dL — ABNORMAL HIGH (ref 70–99)
Potassium: 4.3 mmol/L (ref 3.5–5.1)
Sodium: 138 mmol/L (ref 135–145)
Total Bilirubin: 0.7 mg/dL (ref 0.0–1.2)
Total Protein: 6.9 g/dL (ref 6.5–8.1)

## 2024-05-01 NOTE — Telephone Encounter (Signed)
-----   Message from Norleen ONEIDA Kidney IV sent at 05/01/2024 10:12 AM EDT ----- Please let Mr. Fines know that I spoke with pharmacy and the terbinafine does not interact with his elilquis of the other medications on his current med list. His LFTs were within normal limits.  ----- Message ----- From: Rebecka, Lab In [place] Sent: 05/01/2024   9:36 AM EDT To: Norleen ONEIDA Kidney [place], MD

## 2024-05-01 NOTE — Telephone Encounter (Signed)
 Contacted patient per MD request with message below. Patient verbalized understanding of all information

## 2024-05-01 NOTE — Progress Notes (Signed)
 [facility] [phone]   [phone]  PROGRESS NOTE  Patient Care Team: Regino Slater, MD as PCP - General (Family Medicine) Pietro Redell RAMAN, MD as PCP - Cardiology (Cardiology)  Hematological/Oncological History # Dural Venous Sinus Thrombosis # Prior History of VTE 2017: Provoked RLE DVT after ankle fracture 08/23/2019: US  lower extremity showed nonocclusive chronic DVT/wall thickening involving the distal right femoral vein and right popliteal veins 07/18/2021: CT Head WO contrast showed asymmetric hyperdensity of the right transverse and sigmoid dural venous sinuses, highly suspicious for dural venous sinus thrombosis. Extensive thrombosis of right transverse sinus confirmed by MRI brain and MR venogram of the head. Started on eliquis  therapy.  08/05/2021: establish care with Dr. Federico.  Recommend indefinite anticoagulation therapy. Continue eliquis  5 mg BID.   Interval History:  Shane Martin 48 y.o. 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 The patient's last visit was on 10/20/2023. In the interim since the last visit Shane Martin has continued on his Eliquis  therapy without difficulty.  On exam today Shane Martin reports he has been good overall and the images her last visit.  He said no major changes in his health.  He has been having some occasional issues with numbness in the toes of his right foot.  He is been evaluated for type 2 diabetes and reports he does have untreated nail fungus which may be causing some of the issues.  He reports that he has been doing his best to try to exercise after learning that he had issues with fat deposits in his liver.  He was started on terbinafine therapy as well for his toe issues.  He reports otherwise he has been well.  He notes that he is tolerating Eliquis  with no bleeding, bruising, or dark stools.  He notes that he has had  a few episodes of dizziness and vertigo but nothing major and no falls.  He has not had any trouble with the cost of the medication reports $80 will give him a 69-month supply of the medication.  He has no upcoming planned surgery though he does have a dental cleaning.  He also has some crowns and fillings which he was told he would not need to hold his Eliquis  for.  Otherwise he feels well and has no questions concerns or complaints today.  Full 10 point ROS is otherwise negative.  On a personal note his baby girl is now 55 months old   MEDICAL HISTORY:  Past Medical History:  Diagnosis Date   Anxiety    Asthma    Dural venous sinus thrombosis    DVT (deep venous thrombosis) (HCC)    Elevated LFTs    Prothrombin gene mutation (HCC)    Vertigo     SURGICAL HISTORY: Past Surgical History:  Procedure Laterality Date   No prior surgery      SOCIAL HISTORY: Social History   Socioeconomic History   Marital status: Married    Spouse name: Not on file   Number of children: Not on file   Years of education: Not on file   Highest education level: Not on file  Occupational History   Occupation: Field seismologist    Comment: [employer]  Tobacco Use   Smoking status: Never    Passive exposure: Never   Smokeless tobacco: Never  Vaping Use   Vaping status: Never Used  Substance and Sexual Activity   Alcohol  use: No   Drug use: No   Sexual activity: Not on file  Other Topics Concern   Not on file  Social History Narrative   Not on file   Social Drivers of Health   Financial Resource Strain: Not on file  Food Insecurity: Not on file  Transportation Needs: Not on file  Physical Activity: Not on file  Stress: Not on file  Social Connections: Not on file  Intimate Partner Violence: Not on file    FAMILY HISTORY: Family History  Problem Relation Age of Onset   CAD Father 36    ALLERGIES:  has no active allergies.  MEDICATIONS:  Current Outpatient Medications  Medication  Sig Dispense Refill   albuterol  (VENTOLIN  HFA) 108 (90 Base) MCG/ACT inhaler Inhale 2 puffs into the lungs every 6 (six) hours as needed (excercise induced asthma). (Patient not taking: Reported on 10/17/2022)     atorvastatin  (LIPITOR) 20 MG tablet TAKE 1 TABLET(20 MG) BY MOUTH DAILY 90 tablet 3   ELIQUIS  5 MG TABS tablet TAKE 1 TABLET(5 MG) BY MOUTH TWICE DAILY 180 tablet 1   Meclizine  HCl 25 MG CHEW Chew 1 tablet (25 mg total) by mouth 2 (two) times daily as needed. 30 tablet 0   No current facility-administered medications for this visit.    REVIEW OF SYSTEMS:   Constitutional: ( - ) fevers, ( - )  chills , ( - ) night sweats Eyes: ( - ) blurriness of vision, ( - ) double vision, ( - ) watery eyes Ears, nose, mouth, throat, and face: ( - ) mucositis, ( - ) sore throat Respiratory: ( - ) cough, ( - ) dyspnea, ( - ) wheezes Cardiovascular: ( - ) palpitation, ( - ) chest discomfort, ( - ) lower extremity swelling Gastrointestinal:  ( - ) nausea, ( - ) heartburn, ( - ) change in bowel habits Skin: ( - ) abnormal skin rashes Lymphatics: ( - ) new lymphadenopathy, ( - ) easy bruising Neurological: ( - ) numbness, ( - ) tingling, ( - ) new weaknesses Behavioral/Psych: ( - ) mood change, ( - ) new changes  All other systems were reviewed with the patient and are negative.  PHYSICAL EXAMINATION:  Vitals:   05/01/24 0939  BP: 118/71  Pulse: 65  Resp: 13  Temp: 97.7 F (36.5 C)  SpO2: 100%     Filed Weights   05/01/24 0939  Weight: 194 lb 6.4 oz (88.2 kg)      GENERAL: well appearing middle aged Caucasian male, alert, no distress and comfortable SKIN: skin color, texture, turgor are normal, no rashes or significant lesions EYES: conjunctiva are pink and non-injected, sclera clear LUNGS: clear to auscultation and percussion with normal breathing effort HEART: regular rate & rhythm and no murmurs and no lower extremity edema Musculoskeletal: no cyanosis of digits and no clubbing   PSYCH: alert & oriented x 3, fluent speech NEURO: no focal motor/sensory deficits  LABORATORY DATA:  I have reviewed the data as listed    Latest Ref Rng & Units 05/01/2024    9:31 AM 10/20/2023    2:26 PM 02/09/2023    9:22 AM  CBC  WBC 4.0 - 10.5 K/uL 7.1  9.0  7.2   Hemoglobin 13.0 - 17.0 g/dL 84.1  85.0  84.2   Hematocrit 39.0 - 52.0 % 43.8  42.6  44.2   Platelets 150 - 400 K/uL 190  213  192  Latest Ref Rng & Units 05/01/2024    9:31 AM 10/20/2023    2:26 PM 02/09/2023    9:22 AM  CMP  Glucose 70 - 99 mg/dL 897  829  879   BUN 6 - 20 mg/dL 20  21  17    Creatinine 0.61 - 1.24 mg/dL 9.06  9.09  9.08   Sodium 135 - 145 mmol/L 138  138  139   Potassium 3.5 - 5.1 mmol/L 4.3  3.9  4.1   Chloride 98 - 111 mmol/L 103  104  105   CO2 22 - 32 mmol/L 31  30  29    Calcium  8.9 - 10.3 mg/dL 9.0  9.0  9.0   Total Protein 6.5 - 8.1 g/dL 6.9  6.8  6.8   Total Bilirubin 0.0 - 1.2 mg/dL 0.7  0.6  0.9   Alkaline Phos 38 - 126 U/L 63  62  61   AST 15 - 41 U/L 26  30  31    ALT 0 - 44 U/L 39  56  51     RADIOGRAPHIC STUDIES: No results found.  ASSESSMENT & PLAN Shane Martin 48 y.o. male with medical history significant for prior DVT in 2017 who presents for evaluation of a dural venous sinus thrombosis.  After review of the labs, review of the records, and discussion with the patient the patients findings are most consistent with a provoked right lower extremity DVT and a dural venous sinus thrombosis.  Given his history of easy clotting I would recommend indefinite anticoagulation.  Typically the recommendation would be for limited duration of therapy for dural venous sinus thrombosis, but given his prior history of VTE I would recommend lifelong duration of therapy.  It appears he has quite prone to clotting.  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  Shane Martin  and his wife voiced understanding of this plan moving forward.  We will continue with Eliquis  5 mg twice daily p.o.  # Dural Venous Sinus Thrombosis # Prior History of VTE # Heterozygous Prothrombin Gene Mutation  -- Agree with continued Eliquis  5 mg p.o. twice daily.   -- Patient does not wish to transition to maintenance of Eliquis  2.5 mg twice daily.  This was discussed previously --Given his prior episode of VTE and this new unusual thrombosis strongly recommend indefinite anticoagulation --prior hypercoagulable work-up included antiphospholipid antibody syndrome, protein C, protein S deficiencies, as well as heritable coagulopathies. He was noted to be heterozygous for prothrombin gene mutation. We discussed the modest increase risk in thrombosis with this condition.  --Labs today show white blood cell count 7.1, hemoglobin 15.8, MCV 81.3, platelets 190 --We will plan to see the patient back in approximately 6 months time.  No orders of the defined types were placed in this encounter.  All questions were answered. The patient knows to call the clinic with any problems, questions or concerns.  A total of more than 25 minutes were spent on this encounter with face-to-face time and non-face-to-face time, including preparing to see the patient, ordering tests and/or medications, counseling the patient and coordination of care as outlined above.   Norleen IVAR Kidney, MD Department of Hematology/Oncology [facility] at [facility] Phone: [phone] Pager: [phone] Email: [email or web address]  05/05/2024 5:06 PM

## 2024-05-09 ENCOUNTER — Telehealth: Payer: Self-pay | Admitting: *Deleted

## 2024-05-09 NOTE — Telephone Encounter (Signed)
 TCT patient after receiving vm message requesting a call back. He stated he has an URI and wants to make sure of what he can take for a fever as he is on Eliquis  for Dural Vein Thrombosis. Spoke with him. Advised that he can take Tylenol  for fever ans sore throat. Advised that he should not take any medication with Ibuprofen in it due to taking Eliquis . Advised on dosage of Tylenol , not to exceed 4gms daily. Advised to treat other symptoms as he would normally-without Ibuprofen. Also advised to have a Covid test to be sure he does not have that. Pt voiced understanding.

## 2024-07-05 DIAGNOSIS — B353 Tinea pedis: Secondary | ICD-10-CM | POA: Diagnosis not present

## 2024-07-05 DIAGNOSIS — B351 Tinea unguium: Secondary | ICD-10-CM | POA: Diagnosis not present

## 2024-07-12 ENCOUNTER — Encounter (HOSPITAL_COMMUNITY): Payer: Self-pay

## 2024-07-12 ENCOUNTER — Ambulatory Visit (HOSPITAL_COMMUNITY)
Admission: RE | Admit: 2024-07-12 | Discharge: 2024-07-12 | Disposition: A | Discharge status: Home / Self Care | Source: Ambulatory Visit | Attending: Family Medicine | Admitting: Family Medicine

## 2024-07-12 VITALS — BP 138/81 | HR 78 | Temp 98.1°F | Resp 16

## 2024-07-12 DIAGNOSIS — S90821A Blister (nonthermal), right foot, initial encounter: Secondary | ICD-10-CM

## 2024-07-12 NOTE — ED Triage Notes (Signed)
 Patient here today with c/o a blister on the right foot X 5-6 weeks. Patient states that it is tender and has pain with weightbearing. No known cause. Patient had a DVT in his right leg 8 years ago and also had a blood clot in his brain a few years ago. Patient is on a blood thinner and takes Lipitor.

## 2024-07-12 NOTE — ED Provider Notes (Signed)
 [facility]    CSN: [id number] Arrival date & time: 07/12/24  0902      History   Chief Complaint Chief Complaint  Patient presents with   Blister    Painful blister on bottom of right foot for a number of weeks. - Entered by patient    HPI Shane Martin is a 48 y.o. male.   Shane Martin is a 48 y.o. male presenting for chief complaint of blister to the bottom of the right foot that he first noticed approximately 3 weeks ago after he went for a run that has become more painful and has not resolved.  He went to the PCP for evaluation of this blister 1 week ago, PCP felt blister to be healing normally without signs of infection.  He is here today to get a second opinion since the blister has still been causing him pain.  He feels pain at the site of the blister when he walks/puts pressure on the area.  Pain worsens throughout the day and is more intense later in the evening/bedtime.  Hot showers make the pain to the blister worse.  He has not noticed any surrounding redness, swelling, drainage from the blister, or numbness or tingling to the tips of the right toes.  Denies recent fever/chills or bodyaches.  Denies recent antibiotic or steroid use.  He denies history of immunosuppression.  He is taking Tylenol /using ice to the blister region with some relief of pain temporarily. History of tinea pedis for which he is receiving treatment from PCP at baseline.     Past Medical History:  Diagnosis Date   Anxiety    Asthma    Dural venous sinus thrombosis    DVT (deep venous thrombosis) (HCC)    Elevated LFTs    Prothrombin gene mutation    Vertigo     Patient Active Problem List   Diagnosis Date Noted   Dural venous sinus thrombosis 07/18/2021   History of DVT (deep vein thrombosis) 07/18/2021   Asthma    Vertigo     Past Surgical History:  Procedure Laterality Date   No prior surgery         Home Medications    Prior to Admission medications    Medication Sig Start Date End Date Taking? Authorizing Provider  albuterol  (VENTOLIN  HFA) 108 (90 Base) MCG/ACT inhaler Inhale 2 puffs into the lungs every 6 (six) hours as needed (excercise induced asthma). Patient not taking: Reported on 10/17/2022    [provider]  atorvastatin  (LIPITOR) 20 MG tablet TAKE 1 TABLET(20 MG) BY MOUTH DAILY 09/18/23   Vaslow, Zachary K, MD  ELIQUIS  5 MG TABS tablet TAKE 1 TABLET(5 MG) BY MOUTH TWICE DAILY 02/29/24   Dorsey, John T IV, MD  Meclizine  HCl 25 MG CHEW Chew 1 tablet (25 mg total) by mouth 2 (two) times daily as needed. 10/17/22   Vaslow, Zachary K, MD    Family History Family History  Problem Relation Age of Onset   CAD Father 47    Social History Social History   Tobacco Use   Smoking status: Never    Passive exposure: Never   Smokeless tobacco: Never  Vaping Use   Vaping status: Never Used  Substance Use Topics   Alcohol use: No   Drug use: No     Allergies   Patient has no known allergies.   Review of Systems Review of Systems Per HPI  Physical Exam Triage Vital Signs ED Triage  Vitals  Encounter Vitals Group     BP 07/12/24 0936 138/81     Girls Systolic BP Percentile --      Girls Diastolic BP Percentile --      Boys Systolic BP Percentile --      Boys Diastolic BP Percentile --      Pulse Rate 07/12/24 0936 78     Resp 07/12/24 0936 16     Temp 07/12/24 0936 98.1 F (36.7 C)     Temp Source 07/12/24 0936 Oral     SpO2 07/12/24 0936 96 %     Weight --      Height --      Head Circumference --      Peak Flow --      Pain Score 07/12/24 0935 5     Pain Loc --      Pain Education --      Exclude from Growth Chart --    No data found.  Updated Vital Signs BP 138/81 (BP Location: Left Arm)   Pulse 78   Temp 98.1 F (36.7 C) (Oral)   Resp 16   SpO2 96%   Visual Acuity Right Eye Distance:   Left Eye Distance:   Bilateral Distance:    Right Eye Near:   Left Eye Near:    Bilateral Near:      Physical Exam Vitals and nursing note reviewed.  Constitutional:      Appearance: He is not ill-appearing or toxic-appearing.  HENT:     Head: Normocephalic and atraumatic.     Right Ear: Hearing and external ear normal.     Left Ear: Hearing and external ear normal.     Nose: Nose normal.     Mouth/Throat:     Lips: Pink.  Eyes:     General: Lids are normal. Vision grossly intact. Gaze aligned appropriately.     Extraocular Movements: Extraocular movements intact.     Conjunctiva/sclera: Conjunctivae normal.  Cardiovascular:     Pulses:          Dorsalis pedis pulses are 2+ on the right side.  Pulmonary:     Effort: Pulmonary effort is normal.  Musculoskeletal:     Cervical back: Neck supple.       Feet:  Feet:     Right foot:     Skin integrity: Blister present.     Toenail Condition: Right toenails are normal.     Comments: Blister is nontender to palpation, no surrounding erythema/warmth, no evidence of cellulitis. Non-draining. +2 right DP, less than 2 cap refill distally, sensation intact to distal bilateral lower extremities.   Skin:    General: Skin is warm and dry.     Capillary Refill: Capillary refill takes less than 2 seconds.     Findings: No rash.  Neurological:     General: No focal deficit present.     Mental Status: He is alert and oriented to person, place, and time. Mental status is at baseline.     Cranial Nerves: No dysarthria or facial asymmetry.  Psychiatric:        Mood and Affect: Mood normal.        Speech: Speech normal.        Behavior: Behavior normal.        Thought Content: Thought content normal.        Judgment: Judgment normal.    Blister to the bottom of the right foot      UC  Treatments / Results  Labs (all labs ordered are listed, but only abnormal results are displayed) Labs Reviewed - No data to display  EKG   Radiology No results found.  Procedures Procedures (including critical care time)  Medications  Ordered in UC Medications - No data to display  Initial Impression / Assessment and Plan / UC Course  I have reviewed the triage vital signs and the nursing notes.  Pertinent labs & imaging results that were available during my care of the patient were reviewed by me and considered in my medical decision making (see chart for details).   1.  Blister of right foot without infection Right foot blister appears to be healing normally.  There is no evidence of purulent drainage.  We discussed options for use of pain ease spray today to perform needle puncture incision and drainage of the blister to evaluate further for pus underneath the skin.  Shared decision making used to defer this as he is able to tolerate pain and he would like to wait to see podiatrist for follow-up for ongoing evaluation. No signs of cellulitis/obvious signs of infection on exam. I have advised him to use Dr. Heriberto corn pads to help with ambulation.  Continue Tylenol  as needed for pain.  He is not a candidate for NSAIDs due to blood thinner use. Follow-up with [facility] where he is an established patient.  Counseled patient on potential for adverse effects with medications prescribed/recommended today, strict ER and return-to-clinic precautions discussed, patient verbalized understanding.    Final Clinical Impressions(s) / UC Diagnoses   Final diagnoses:  Blister of right foot without infection, initial encounter     Discharge Instructions      Please follow-up with [facility].  If you develop redness, swelling, worsening pain, please return to urgent care in the meantime for re-evaluation.     ED Prescriptions   None    PDMP not reviewed this encounter.   Enedelia Dorna HERO, [place] 07/12/24 1121

## 2024-07-12 NOTE — Discharge Instructions (Signed)
 Please follow-up with [facility].  If you develop redness, swelling, worsening pain, please return to urgent care in the meantime for re-evaluation.

## 2024-07-17 ENCOUNTER — Telehealth: Payer: Self-pay | Admitting: Lab

## 2024-07-17 ENCOUNTER — Encounter: Payer: Self-pay | Admitting: Podiatry

## 2024-07-17 ENCOUNTER — Ambulatory Visit: Admitting: Podiatry

## 2024-07-17 DIAGNOSIS — Z79899 Other long term (current) drug therapy: Secondary | ICD-10-CM | POA: Diagnosis not present

## 2024-07-17 DIAGNOSIS — B999 Unspecified infectious disease: Secondary | ICD-10-CM | POA: Diagnosis not present

## 2024-07-17 DIAGNOSIS — B351 Tinea unguium: Secondary | ICD-10-CM

## 2024-07-17 MED ORDER — DOXYCYCLINE HYCLATE 100 MG PO TABS
100.0000 mg | ORAL_TABLET | Freq: Two times a day (BID) | ORAL | 0 refills | Status: AC
Start: 2024-07-17 — End: ?

## 2024-07-17 MED ORDER — TERBINAFINE HCL 250 MG PO TABS: 250.0000 mg | ORAL_TABLET | Freq: Every day | ORAL | 0 refills | Status: AC

## 2024-07-17 NOTE — Progress Notes (Signed)
 Subjective:  Patient ID: Shane Martin, male    DOB: 03/17/1976,  MRN: [id number]  Chief Complaint  Patient presents with   Foot Pain    Pt stated that he has this blister on the bottom of his foot that he has been dealing with for a while now     48 y.o. male presents with the above complaint.  Patient presents for right foot superinfection.  He states he is having a lot of blisters that seems to pop with some pustule pockets.  He states been going on for a while now and wants to discuss treatment options for this.  Is causing him some discomfort he has tried some over-the-counter options he has not seen MRIs prior to seeing me for this.  He would like to discuss treatment options for this.  Nothing to correlate that he is taking.   Review of Systems: Negative except as noted in the HPI. Denies N/V/F/Ch.  Past Medical History:  Diagnosis Date   Anxiety    Asthma    Dural venous sinus thrombosis    DVT (deep venous thrombosis) (HCC)    Elevated LFTs    Prothrombin gene mutation    Vertigo     Current Outpatient Medications:    amoxicillin-clavulanate (AUGMENTIN) 875-125 MG tablet, Take 1 tablet by mouth 2 (two) times daily., Disp: 28 tablet, Rfl: 0   doxycycline (VIBRA-TABS) 100 MG tablet, Take 1 tablet (100 mg total) by mouth 2 (two) times daily., Disp: 28 tablet, Rfl: 0   terbinafine (LAMISIL) 250 MG tablet, Take 1 tablet (250 mg total) by mouth daily., Disp: 30 tablet, Rfl: 0   albuterol  (VENTOLIN  HFA) 108 (90 Base) MCG/ACT inhaler, Inhale 2 puffs into the lungs every 6 (six) hours as needed (excercise induced asthma). (Patient not taking: Reported on 10/17/2022), Disp: , Rfl:    atorvastatin  (LIPITOR) 20 MG tablet, TAKE 1 TABLET(20 MG) BY MOUTH DAILY, Disp: 90 tablet, Rfl: 3   ELIQUIS  5 MG TABS tablet, TAKE 1 TABLET(5 MG) BY MOUTH TWICE DAILY, Disp: 180 tablet, Rfl: 1   Meclizine  HCl 25 MG CHEW, Chew 1 tablet (25 mg total) by mouth 2 (two) times daily as needed., Disp: 30 tablet,  Rfl: 0  Social History   Tobacco Use  Smoking Status Never   Passive exposure: Never  Smokeless Tobacco Never    No Known Allergies Objective:  There were no vitals filed for this visit. There is no height or weight on file to calculate BMI. Constitutional Well developed. Well nourished.  Vascular Dorsalis pedis pulses palpable bilaterally. Posterior tibial pulses palpable bilaterally. Capillary refill normal to all digits.  No cyanosis or clubbing noted. Pedal hair growth normal.  Neurologic Normal speech. Oriented to person, place, and time. Epicritic sensation to light touch grossly present bilaterally.  Dermatologic Right forefoot superinfection with superficial posterior pocket with blistering.  Subjective complaint of itching noted.  No deep wounds noted.  No other abnormalities identified  Orthopedic: Normal joint ROM without pain or crepitus bilaterally. No visible deformities. No bony tenderness.   Radiographs: None Assessment:   1. Long-term use of high-risk medication   2. Onychomycosis due to dermatophyte   3. Superinfection    Plan:  Patient was evaluated and treated and all questions answered.  Right foot superinfection/athlete's foot - All questions and concerns were discussed with the patient extensively given the presence of superinfection with bacterial and fungal component patient will benefit from Lamisil for 30 days and doxycycline for 14 days.  At this time I discussed both of those options he has a history of fatty liver disease but his liver enzymes are within normal limits he will proceed with 30 days of doxycycline he will clear with his primary care physician. - He may have some contraindication with his other medication against doxycycline we will try Augmentin instead.  Patient is in agreement with the plan. - Shoe gear modification discussed - I will see him back again in 4-6 weeks to monitor for improvement

## 2024-07-17 NOTE — Telephone Encounter (Signed)
 Patient aware

## 2024-07-17 NOTE — Telephone Encounter (Signed)
 Patient wants to know which medication he should start first please call patient and advise.

## 2024-07-18 ENCOUNTER — Encounter: Payer: Self-pay | Admitting: Podiatry

## 2024-07-18 ENCOUNTER — Telehealth: Payer: Self-pay | Admitting: *Deleted

## 2024-07-18 LAB — HEPATIC FUNCTION PANEL
ALT: 32 IU/L (ref 0–44)
AST: 22 IU/L (ref 0–40)
Albumin: 4.8 g/dL (ref 4.1–5.1)
Alkaline Phosphatase: 75 IU/L (ref 47–123)
Bilirubin Total: 0.6 mg/dL (ref 0.0–1.2)
Bilirubin, Direct: 0.21 mg/dL (ref 0.00–0.40)
Total Protein: 7.4 g/dL (ref 6.0–8.5)

## 2024-07-18 NOTE — Telephone Encounter (Signed)
-----   Message from Shane Martin sent at 07/18/2024 12:11 PM EDT ----- No concerns ----- Message ----- From: Shane Martin ORN, RN Sent: 07/18/2024  11:57 AM EDT To: Shane K Vaslow, MD  This patient called, he was just diagnosed with a bacterial & fungal infection on his foot.  His podiatrist prescribed both doxycyline & lamisil.  He has done some research & saw that one of the side effects of doxycyline is increased brain pressure.  His podiatrist offered to change his antibiotic to augmentin if Shane Martin prefers, he wants your opinion before making a decision.  He is concerned about the chance of increased brain pressure with his diagnosis and is asking what is the lowest risk antibiotic for him.  FYI, he called Beth about this as well, she spoke with Dr Federico & Izetta, they are both fine with him taking doxy.

## 2024-07-18 NOTE — Telephone Encounter (Signed)
 Received call from pt. He states he has an infected blister on his foot and his podiatrist prescribed Doxcycline. He is concerned because he read that there is a very small chance of brain swelling with Doxycycline. He is concerned as he has a dural venous sinus clot. Spoke with Dr. Federico. Advised pt that Dr. Federico is not concerned with this extremely rare potential side effect., Advised that Dr. Federico recommneds he take the medication for the infection, that it is very often used for skin infections. Pt voiced understanding and will start this medication today.

## 2024-07-18 NOTE — Telephone Encounter (Signed)
 PC to patient, informed him Dr Buckley has no concern with him taking doxycycline.  He verbalizes understanding.

## 2024-07-19 ENCOUNTER — Other Ambulatory Visit: Payer: Self-pay | Admitting: Podiatry

## 2024-07-19 DIAGNOSIS — B351 Tinea unguium: Secondary | ICD-10-CM | POA: Diagnosis not present

## 2024-07-19 DIAGNOSIS — B353 Tinea pedis: Secondary | ICD-10-CM | POA: Diagnosis not present

## 2024-07-19 MED ORDER — AMOXICILLIN-POT CLAVULANATE 875-125 MG PO TABS: 1.0000 | ORAL_TABLET | Freq: Two times a day (BID) | ORAL | 0 refills | Status: AC

## 2024-08-14 ENCOUNTER — Ambulatory Visit: Admitting: Podiatry

## 2024-08-14 DIAGNOSIS — Z79899 Other long term (current) drug therapy: Secondary | ICD-10-CM

## 2024-08-14 DIAGNOSIS — K760 Fatty (change of) liver, not elsewhere classified: Secondary | ICD-10-CM | POA: Insufficient documentation

## 2024-08-14 DIAGNOSIS — B351 Tinea unguium: Secondary | ICD-10-CM | POA: Diagnosis not present

## 2024-08-14 DIAGNOSIS — Q666 Other congenital valgus deformities of feet: Secondary | ICD-10-CM | POA: Diagnosis not present

## 2024-08-14 DIAGNOSIS — B999 Unspecified infectious disease: Secondary | ICD-10-CM

## 2024-08-14 DIAGNOSIS — J4520 Mild intermittent asthma, uncomplicated: Secondary | ICD-10-CM | POA: Insufficient documentation

## 2024-08-14 NOTE — Progress Notes (Signed)
 Subjective:  Patient ID: Shane Martin, male    DOB: [birth year],  MRN: [id number]  Chief Complaint  Patient presents with   Long-term use of high-risk medication    Pt is here for a fungus follow up     48 y.o. male presents with the above complaint.  Patient presents with right foot superinfection is doing a lot better.  Antibiotics helped considerably.  He would also like to do orthotics denies any other acute complaints   Review of Systems: Negative except as noted in the HPI. Denies N/V/F/Ch.  Past Medical History:  Diagnosis Date   Anxiety    Asthma    Dural venous sinus thrombosis    DVT (deep venous thrombosis) (HCC)    Elevated LFTs    Prothrombin gene mutation    Vertigo     Current Outpatient Medications:    amoxicillin -clavulanate (AUGMENTIN ) 875-125 MG tablet, Take 1 tablet by mouth 2 (two) times daily., Disp: 28 tablet, Rfl: 0   azithromycin (ZITHROMAX) 250 MG tablet, 2 tablets  on the first day, then 1 tablet daily for 4 days Orally Once a day; Duration: 5 days, Disp: , Rfl:    benzonatate (TESSALON) 200 MG capsule, 1 capsule., Disp: , Rfl:    ciclopirox (PENLAC) 8 % solution, 1 drop to affected nail at bedtime Externally Once a day at night, wash in AM. Clean nail with alcohol every 7 days and file it down; Duration: 30 days, Disp: , Rfl:    guaiFENesin-codeine 100-10 MG/5ML syrup, 5 mL as needed Orally once a day if needed for coughing; Duration: 5 days, Disp: , Rfl:    albuterol  (VENTOLIN  HFA) 108 (90 Base) MCG/ACT inhaler, Inhale 2 puffs into the lungs every 6 (six) hours as needed (excercise induced asthma). (Patient not taking: Reported on 10/17/2022), Disp: , Rfl:    atorvastatin  (LIPITOR) 20 MG tablet, TAKE 1 TABLET(20 MG) BY MOUTH DAILY, Disp: 90 tablet, Rfl: 3   doxycycline  (VIBRA -TABS) 100 MG tablet, Take 1 tablet (100 mg total) by mouth 2 (two) times daily., Disp: 28 tablet, Rfl: 0   ELIQUIS  5 MG TABS tablet, TAKE 1 TABLET(5 MG) BY MOUTH TWICE DAILY, Disp:  180 tablet, Rfl: 1   Meclizine  HCl 25 MG CHEW, Chew 1 tablet (25 mg total) by mouth 2 (two) times daily as needed., Disp: 30 tablet, Rfl: 0   terbinafine  (LAMISIL ) 250 MG tablet, Take 1 tablet (250 mg total) by mouth daily., Disp: 30 tablet, Rfl: 0  Social History   Tobacco Use  Smoking Status Never   Passive exposure: Never  Smokeless Tobacco Never    No Known Allergies Objective:  There were no vitals filed for this visit. There is no height or weight on file to calculate BMI. Constitutional Well developed. Well nourished.  Vascular Dorsalis pedis pulses palpable bilaterally. Posterior tibial pulses palpable bilaterally. Capillary refill normal to all digits.  No cyanosis or clubbing noted. Pedal hair growth normal.  Neurologic Normal speech. Oriented to person, place, and time. Epicritic sensation to light touch grossly present bilaterally.  Dermatologic Right forefoot superinfection without any blister ration anymore.  No further itching noted no open wounds or lesion noted clinically healed.  Orthopedic: Normal joint ROM without pain or crepitus bilaterally. No visible deformities. No bony tenderness.   Radiographs: None Assessment:   1. Onychomycosis due to dermatophyte   2. Superinfection   3. Long-term use of high-risk medication     Plan:  Patient was evaluated and treated and all questions  answered.  Right foot superinfection/athlete's foot - All questions and concerns were discussed with the patient extensively given the   He is clinically healed at this time.  Patient completed the course of doxycycline .  I discussed with him we can stop with the Lamisil  for the superinfection.  However for the nail fungus he would like to continue with the Lamisil  he will let me know I will talk to his primary care physician and will get back to me if he is going to start the medication and he will see me back in 4 months.  He has  Pes planovalgus/foot deformity -I explained  to patient the etiology of pes planovalgus and relationship with heel pain/arch pain and various treatment options were discussed.  Given patient foot structure in the setting of heel pain/arch pain I believe patient will benefit from custom-made orthotics to help control the hindfoot motion support the arch of the foot and take the stress away from arches.  Patient agrees with the plan like to proceed with orthotics -Patient was casted for orthotics

## 2024-09-01 ENCOUNTER — Other Ambulatory Visit: Payer: Self-pay | Admitting: Internal Medicine

## 2024-09-01 ENCOUNTER — Other Ambulatory Visit: Payer: Self-pay | Admitting: Hematology and Oncology

## 2024-09-05 ENCOUNTER — Telehealth: Payer: Self-pay

## 2024-09-05 ENCOUNTER — Other Ambulatory Visit: Payer: Self-pay | Admitting: Podiatry

## 2024-09-05 NOTE — Telephone Encounter (Signed)
 Patient's Orthotics are in and charges have not been entered.

## 2024-09-18 NOTE — Telephone Encounter (Signed)
 Appt 09/20/24 to [facility]

## 2024-09-20 ENCOUNTER — Other Ambulatory Visit

## 2024-10-11 ENCOUNTER — Other Ambulatory Visit

## 2024-11-01 ENCOUNTER — Inpatient Hospital Stay

## 2024-11-01 ENCOUNTER — Other Ambulatory Visit

## 2024-11-01 ENCOUNTER — Ambulatory Visit: Admitting: Hematology and Oncology

## 2024-11-01 ENCOUNTER — Inpatient Hospital Stay: Admitting: Hematology and Oncology
